# Patient Record
Sex: Female | Born: 1965
Health system: Southern US, Community
[De-identification: ages and names within clinical notes are randomized; demographics above are authoritative.]

## PROBLEM LIST (undated history)

## (undated) DIAGNOSIS — K449 Diaphragmatic hernia without obstruction or gangrene: Secondary | ICD-10-CM

## (undated) HISTORY — PX: VAGINAL PROLAPSE REPAIR: SHX830

## (undated) HISTORY — PX: TOTAL VAGINAL HYSTERECTOMY: SHX2548

## (undated) HISTORY — DX: Diaphragmatic hernia without obstruction or gangrene: K44.9

## (undated) HISTORY — PX: DILATION AND CURETTAGE OF UTERUS: SHX78

## (undated) HISTORY — PX: ABDOMINAL HYSTERECTOMY: SHX81

---

## 1997-12-27 ENCOUNTER — Other Ambulatory Visit: Admission: RE | Admit: 1997-12-27 | Discharge: 1997-12-27 | Payer: Self-pay | Admitting: Gynecology

## 1998-04-11 ENCOUNTER — Other Ambulatory Visit: Admission: RE | Admit: 1998-04-11 | Discharge: 1998-04-11 | Payer: Self-pay | Admitting: Gynecology

## 1998-06-10 ENCOUNTER — Ambulatory Visit (HOSPITAL_COMMUNITY): Admission: RE | Admit: 1998-06-10 | Discharge: 1998-06-10 | Payer: Self-pay | Admitting: Gynecology

## 1998-06-10 ENCOUNTER — Encounter: Payer: Self-pay | Admitting: Gynecology

## 1998-12-15 ENCOUNTER — Emergency Department (HOSPITAL_COMMUNITY): Admission: EM | Admit: 1998-12-15 | Discharge: 1998-12-15 | Payer: Self-pay | Admitting: Emergency Medicine

## 1999-01-03 ENCOUNTER — Other Ambulatory Visit: Admission: RE | Admit: 1999-01-03 | Discharge: 1999-01-03 | Payer: Self-pay | Admitting: Gynecology

## 1999-09-20 ENCOUNTER — Encounter: Admission: RE | Admit: 1999-09-20 | Discharge: 1999-12-19 | Payer: Self-pay | Admitting: Gynecology

## 1999-09-25 ENCOUNTER — Encounter: Payer: Self-pay | Admitting: Gynecology

## 1999-09-25 ENCOUNTER — Ambulatory Visit (HOSPITAL_COMMUNITY): Admission: RE | Admit: 1999-09-25 | Discharge: 1999-09-25 | Payer: Self-pay | Admitting: Gynecology

## 2000-01-11 ENCOUNTER — Other Ambulatory Visit: Admission: RE | Admit: 2000-01-11 | Discharge: 2000-01-11 | Payer: Self-pay | Admitting: Gynecology

## 2001-02-27 ENCOUNTER — Other Ambulatory Visit: Admission: RE | Admit: 2001-02-27 | Discharge: 2001-02-27 | Payer: Self-pay | Admitting: Gynecology

## 2001-06-25 ENCOUNTER — Encounter: Admission: RE | Admit: 2001-06-25 | Discharge: 2001-09-23 | Payer: Self-pay | Admitting: Gynecology

## 2001-12-21 ENCOUNTER — Inpatient Hospital Stay (HOSPITAL_COMMUNITY): Admission: AD | Admit: 2001-12-21 | Discharge: 2001-12-24 | Payer: Self-pay | Admitting: Gynecology

## 2002-01-28 ENCOUNTER — Other Ambulatory Visit: Admission: RE | Admit: 2002-01-28 | Discharge: 2002-01-28 | Payer: Self-pay | Admitting: Gynecology

## 2003-06-07 ENCOUNTER — Other Ambulatory Visit: Admission: RE | Admit: 2003-06-07 | Discharge: 2003-06-07 | Payer: Self-pay | Admitting: Gynecology

## 2004-06-12 ENCOUNTER — Other Ambulatory Visit: Admission: RE | Admit: 2004-06-12 | Discharge: 2004-06-12 | Payer: Self-pay | Admitting: Gynecology

## 2005-01-17 ENCOUNTER — Emergency Department (HOSPITAL_COMMUNITY): Admission: EM | Admit: 2005-01-17 | Discharge: 2005-01-17 | Payer: Self-pay | Admitting: Emergency Medicine

## 2005-03-05 ENCOUNTER — Inpatient Hospital Stay (HOSPITAL_COMMUNITY): Admission: RE | Admit: 2005-03-05 | Discharge: 2005-03-07 | Payer: Self-pay | Admitting: Gynecology

## 2005-03-05 ENCOUNTER — Encounter (INDEPENDENT_AMBULATORY_CARE_PROVIDER_SITE_OTHER): Payer: Self-pay | Admitting: *Deleted

## 2006-04-02 ENCOUNTER — Other Ambulatory Visit: Admission: RE | Admit: 2006-04-02 | Discharge: 2006-04-02 | Payer: Self-pay | Admitting: Gynecology

## 2006-05-30 ENCOUNTER — Encounter: Admission: RE | Admit: 2006-05-30 | Discharge: 2006-05-30 | Payer: Self-pay | Admitting: Gastroenterology

## 2007-04-07 ENCOUNTER — Other Ambulatory Visit: Admission: RE | Admit: 2007-04-07 | Discharge: 2007-04-07 | Payer: Self-pay | Admitting: Gynecology

## 2008-02-09 ENCOUNTER — Ambulatory Visit: Payer: Self-pay | Admitting: Gynecology

## 2008-04-19 ENCOUNTER — Other Ambulatory Visit: Admission: RE | Admit: 2008-04-19 | Discharge: 2008-04-19 | Payer: Self-pay | Admitting: Gynecology

## 2008-04-19 ENCOUNTER — Ambulatory Visit: Payer: Self-pay | Admitting: Gynecology

## 2008-04-19 ENCOUNTER — Encounter: Payer: Self-pay | Admitting: Gynecology

## 2009-03-15 ENCOUNTER — Ambulatory Visit: Payer: Self-pay | Admitting: Gynecology

## 2009-03-15 ENCOUNTER — Ambulatory Visit (HOSPITAL_COMMUNITY): Admission: RE | Admit: 2009-03-15 | Discharge: 2009-03-15 | Payer: Self-pay | Admitting: Gynecology

## 2009-04-20 ENCOUNTER — Ambulatory Visit: Payer: Self-pay | Admitting: Gynecology

## 2009-04-20 ENCOUNTER — Other Ambulatory Visit: Admission: RE | Admit: 2009-04-20 | Discharge: 2009-04-20 | Payer: Self-pay | Admitting: Gynecology

## 2009-11-08 ENCOUNTER — Ambulatory Visit: Payer: Self-pay | Admitting: Gynecology

## 2010-01-31 ENCOUNTER — Ambulatory Visit: Payer: Self-pay | Admitting: Gynecology

## 2010-02-14 ENCOUNTER — Encounter: Admission: RE | Admit: 2010-02-14 | Discharge: 2010-02-14 | Payer: Self-pay | Admitting: General Surgery

## 2010-04-21 ENCOUNTER — Ambulatory Visit: Payer: Self-pay | Admitting: Gynecology

## 2010-04-21 ENCOUNTER — Other Ambulatory Visit
Admission: RE | Admit: 2010-04-21 | Discharge: 2010-04-21 | Payer: Self-pay | Source: Home / Self Care | Admitting: Gynecology

## 2010-05-03 ENCOUNTER — Ambulatory Visit: Payer: Self-pay | Admitting: Gynecology

## 2010-05-12 ENCOUNTER — Ambulatory Visit: Admit: 2010-05-12 | Payer: Self-pay | Admitting: Gynecology

## 2010-09-29 NOTE — Discharge Summary (Signed)
   [name], Cheryl Booth                             ACCOUNT NO.:  [id number]   MEDICAL RECORD NO.:  [id number]                   PATIENT TYPE:   LOCATION:                                       FACILITY:   PHYSICIAN:  [id number]                                DATE OF BIRTH:   DATE OF ADMISSION:  12/20/2001  DATE OF DISCHARGE:  12/24/2001                                 DISCHARGE SUMMARY   DISCHARGE DIAGNOSES:  1. Intrauterine pregnancy 40+ weeks delivered.  2. Advanced maternal age.  3. Status post spontaneous vaginal delivery.   HISTORY:  A 45year-old white female, gravida 3, para 0 with an estimated  date of confinement of 12/13/01. Prenatal course complicated by history of  fibroid uterus and advanced maternal age and subsequently underwent  amniocentesis with revealed normal chromosome.   HOSPITAL COURSE:  On 12/22/01 the patient was admitted for induction at 40+  weeks with Pitocin.  On 12/22/01 at 1525 the patient had a spontaneous  vaginal delivery of a female, Agpars 9 and 9, weighing 9 pounds and 12 ounces.  There was a midline episiotomy with a third degree laceration which was  repaired without difficulty.  Postpartum the patient was afebrile, voided  and in stable condition.  She was discharged home on 12/24/01 in  satisfactory condition.   LABORATORY DATA:  The patient is 0 positive. Rubella immune.  On 12/23/01  patient's hemoglobin was 9.5 and hematocrit 27.7.   DISPOSITION:  The patient was discharged home to return in six weeks and  given a prescription for Motrin 600 mg q.h.s. p.r.n. pain.     Susa Loffler, P.A.                    [phone]    TSG/MEDQ  D:  01/23/2002  T:  01/23/2002  Job:  [id number]

## 2010-09-29 NOTE — Discharge Summary (Signed)
NAMEGERLINE, RATTO                 ACCOUNT NO.:  [id number]   MEDICAL RECORD NO.:  [id number]          PATIENT TYPE:  INP   LOCATION:  9309                          FACILITY:  [facility]   PHYSICIAN:  Juan H. Lily Peer, M.D.DATE OF BIRTH:  08/31/1962   DATE OF ADMISSION:  03/05/2005  DATE OF DISCHARGE:  03/07/2005                                 DISCHARGE SUMMARY   TOTAL DAYS HOSPITALIZED:  Two.   HISTORY:  The patient is a 45 year old gravida 3, para 1, AB 2, who was  admitted on the morning of October 23, where she underwent a transvaginal  hysterectomy with anterior and posterior colporrhaphy secondary to pelvic  relaxation.  No stress urinary incontinence and no enterocele.  The patient  intraoperatively did well.  She had a blood loss of 50 mL and received 2000  mL of lactated Ringer's.  Urine output had been 200 mL.  The patient  postoperative day #1 was doing well.  That evening she was complaining of  some discomfort in her chest.  She had a portable chest x-ray which was  normal.  Electrolytes were normal.  Cardiac enzymes were normal.  EKG with  normal sinus rhythm.  She started using incentive spirometry and her  symptoms improved.  Followed today before being discharged, on palpation it  appears that she has of a costochondritis because it was tender on the left  sternal border on palpation.  Cardiac auscultation was otherwise normal.  Her hemoglobin and hematocrit were 11.1 and 33.2, respectively, with a  platelet count of 289,000.  Her first postoperative day her diet was  advanced from clear liquid to a regular diet.  She was up and ambulating,  passing gas.  On the second postoperative day she showered.  There was no  vaginal bleeding.  Her abdomen had good bowel sounds.  The lungs were clear  to auscultation.  Heart regular rate and rhythm, no murmurs or gallops.  The  patient was afebrile and she was ready to be discharged home.   DIAGNOSES:  1.  Diagnosis pelvic  relaxation.  2.  Second-degree cystocele.  3.  Second-degree rectocele.   PROCEDURES PERFORMED:  1.  Transvaginal hysterectomy.  2.  Anterior and posterior colporrhaphy.   FINAL DISPOSITION AND FOLLOW-UP:  the patient was discharged home on HER  second postoperative day.  she was instructed to stay on stool softener, one  Colace before bedtime, and Metamucil 1 tablespoon with juice every morning,  to increase her fluid intake, and a high-fiber diet.  She was given a  prescription of Lortab 2.5/500 one p.o. q.4-6h. p.r.n. pain, and a  prescription for Reglan 30 mg to take one p.o. q.4-6h. p.r.n. nausea and/or  vomiting.  She was given a discharge instruction sheet, to avoid any lifting  or strenuous activity.  No driving for at least one week, and to follow up  in the office in two weeks for a postop visit.      Juan H. Lily Peer, M.D.  Electronically Signed    JHF/MEDQ  D:  03/07/2005  T:  03/07/2005  Job:  [id number]

## 2010-09-29 NOTE — H&P (Signed)
NAMETERISSA, Cheryl Booth                 ACCOUNT NO.:  [id number]   MEDICAL RECORD NO.:  [id number]          PATIENT TYPE:  INP   LOCATION:  NA                            [facility]  [facility]   PHYSICIAN:  Juan H. Lily Peer, M.D.DATE OF BIRTH:  08/31/1962   DATE OF ADMISSION:  03/05/2005  DATE OF DISCHARGE:                                HISTORY & PHYSICAL   CHIEF COMPLAINT:  1.  Symptomatic uterine prolapse.  2.  Second-degree cystocele, first-degree rectocele.   HISTORY OF PRESENT ILLNESS:  The patient is a 45 year old, gravida 3, para  1, AB2, with a several-year history of pelvic relaxation, cystocele and  rectocele.  The patient with no evidence of stress urinary incontinence.  She had postponed her surgery because of personal issues.  She has had a  pessary through all this time and now is prepared to undergo transvaginal  hysterectomy with ovarian conservation and anterior and posterior  colporrhaphy.  The patient on last annual gynecological examination with Pap  smear had been in January of this year, whereby her Pap smear was reported  to be normal.  Her last mammogram was in February of this year which was  normal as well.  The patient had an ultrasound done in the office in August  of this year.  She has a retroverted uterus measuring 7.3 x 5.4 x 5.1 cm  with an endometrial stripe of 8.1 mm and echogenic.  She has several small  intramural myomas less than 2 cm in size.  Right ovary was normal.  Left  ovary had a simple echo-free cyst, avascular, measuring 20 x 17 x 22 mm.  There was no fluid in the cul-de-sac.  The patient is now consented to  proceed with a transvaginal hysterectomy with anterior and posterior  colporrhaphy.   ALLERGIES:  ASPIRIN, NONSTEROIDAL ANTI-INFLAMMATORY DRUGS.   PAST MEDICAL HISTORY:  1.  She has had one normal spontaneous vagina delivery with macrosomia      infant.  2.  She wears a pessary for pelvic relaxation support.   CURRENT MEDICATIONS:  1.   Multivitamin.  2.  Calcium.  3.  Vitamin C.  4.  Folic acid.   PAST SURGICAL HISTORY:  Two D&Cs in the past.   PHYSICAL EXAMINATION:  VITAL SIGNS:  Weight 163 pounds, 5 feet 1/2 inch  tall.  Blood pressure 126/80.  HEENT:  Unremarkable.  NECK:  Supple.  Trachea midline.  No carotid bruits.  No thyromegaly.  LUNGS:  Clear to auscultation without rhonchi or wheezes.  HEART:  Regular rate and rhythm.  No murmurs or gallops.  ABDOMEN:  Soft, nontender, without rebound or guarding.  PELVIC:  Bartholin's, urethra, and Skene's glands were within normal limits.  Vagina and cervix:  The patient with a first to second degree rectocele,  first to second degree cystocele.  No enterocele.  Pelvic relaxation was  evident in the erect position.  RECTAL:  Exam was unremarkable.   ASSESSMENT:  A 45 year old, gravida 3, para 1, AB 2, with symptomatic pelvic  relaxation, second-degree cystocele, first to second degree rectocele  with  pelvic relaxation, no evidence of enterocele.  The patient interested in  proceeding with definitive surgery at this point but with ovarian  conservation, so she is planned for a transvaginal hysterectomy with  anterior and posterior colporrhaphy.  The risks, benefits, and pros and cons  of the operation were discussed with the patient to include infection (she  will receive prophylaxis antibiotics), the risks for deep venous thrombosis  (she will have pneumatics compression stockings to prevent DVT).  In the  event of uncontrollable hemorrhage, as a life-saving measure and if the  patient were to need blood or blood products, she is fully aware of the  potential risks for anaphylactic reaction, hepatitis, and AIDS.  In the  event of technical difficulty, the procedure would not be able to be  accomplished vaginally or in the event of any trauma, the patient is fully  aware that an abdominal approach may need to be undertaken at that point.  All of this information was  discussed with the patient in Spanish on several  occasions in the past as well as most recent preoperative consultation, but  she was also given literature information on the surgery as well.  She was  asked to start on Premarin vaginal cream to apply intravaginally daily for 2  weeks prior to her surgery to facilitate the dissection.  All questions were  answered and will follow accordingly.   PLAN:  The patient is scheduled for transvaginal hysterectomy with anterior  posterior colporrhaphy on Monday, October 23, at [facility].      [place] H. Lily Peer, M.D.  Electronically Signed     JHF/MEDQ  D:  02/28/2005  T:  02/28/2005  Job:  [id number]

## 2010-09-29 NOTE — Op Note (Signed)
NAMEMATTIE, Cheryl Booth                 ACCOUNT NO.:  [id number]   MEDICAL RECORD NO.:  [id number]          PATIENT TYPE:  INP   LOCATION:  9309                          FACILITY:  [facility]   PHYSICIAN:  Juan H. Lily Peer, M.D.DATE OF BIRTH:  08/31/1962   DATE OF PROCEDURE:  03/05/2005  DATE OF DISCHARGE:                                 OPERATIVE REPORT   SURGEON:  Juan H. Lily Peer, M.D.   ASSISTANTMarcial Pacas P. Fontaine, M.D.   INDICATIONS FOR OPERATION:  45 year old with symptomatic uterine prolapse  with second-degree cystocele and second-degree rectocele.   PREOPERATIVE DIAGNOSIS:  1.  Pelvic relaxation.  2.  Second-degree cystocele.  3.  Second-degree rectocele.   ANESTHESIA:  Was general endotracheal anesthesia.   PROCEDURE PERFORMED:  Transvaginal hysterectomy with anterior and posterior  colporrhaphy.   FINDINGS:  The patient with pelvic relaxation. No evidence of enterocele.  Second-degree cystocele and second-degree rectocele. Normal-appearing  ovaries.   DESCRIPTION OF OPERATION:  After the patient was adequately counseled, she  was taken to the operating room where she underwent general endotracheal  anesthesia. She had PSA stockings for DVT prophylaxis and had received 2  grams of cefotetan for prophylaxis as well. Her legs were placed and she was  placed in the high lithotomy position and the vagina and perineum were  prepped and draped in usual sterile fashion. The short weighted speculum was  placed in posterior vaginal vault. A Foley catheter was inserted in effort  to monitor urinary output. Two Lahey thyroid clamps, one was placed in the  anterior cervical lip. The second on the posterior cervical lip. 2%  Xylocaine with 1:100,000 epinephrine was infiltrated into the cervical  vaginal fold in a circumferential fashion for a total of 10 mL. With the use  of a scalpel the cervical vaginal fold was identified and incision was made  in the circumferential fashion.  Division of the supravaginal septum with a  scalpel was accomplished and the supravaginal septum was incised with a  scissors and cranial displacement of bladder with retractors placed between  the vesicouterine ligaments was accomplished. The peritoneal reflection was  identified. Digital displacement of the vesicouterine ligaments laterally  and the bladder and ureters cranially. The ureters were then palpated  against the lateral speculum. The anterior cul-de-sac was opened and the  retractor was placed in the anterior cul-de-sac. The posterior incision was  accomplished with the Mayo scissors and posterior colpotomy was  accomplished. A short weighted speculum was changed to a long weighted  speculum and both uterosacral ligaments were clamped, cut, suture ligated  with 0 Vicryl suture. The remaining cardinal and broad ligaments were  clamped, cut and suture ligated with 0 Vicryl suture. The uterine vessels  were clamped, cut and suture ligated with 0 Vicryl suture. The left adnexal  pedicle and round ligament was clamped and cut. Similar procedure was  carried out on the contralateral side and the uterus and cervix was passed  off the operative field. The pedicles were free tied with 0 Vicryl suture  followed by transfixation stitch of 0 Vicryl suture.  A tagged moist lap was  placed into the vagina and the anterior colpotomy was started. The vaginal  mucosa was undermined in the vesicle vaginal space separating the connective  tissue from the vaginal wall and the vesicovaginal space was accomplished  with the Metzenbaum scissors. The insertion of the urogenital diaphragm at  the vaginal wall was divided sharply. Wide lateral exposure of the bladder  and the vesicovaginal space with the Metzenbaum scissors was accomplished  for dissection. Further exposure of the bladder laterally of the  vesicovaginal space with digital dissection. The anterior repair was started  by approximating the  vesicouterine ligament with 2-0 Vicryl suture in  interrupted fashion along with plication of the bladder fascia with  interrupted sutures of 2-0 Vicryl suture to include and approximate the  retracted structures of the urogenital diaphragm. The anterior colporrhaphy  was completed and the excess vaginal mucosa was excised and the vaginal  mucosa was then closed with the running locking stitch of 0 Vicryl suture.  The vaginal cuff was then closed with a running interrupted sutures of 0  Vicryl suture and the posterior colpotomy was accomplished by undermining  the posterior vaginal wall and completed posterior colpotomy with sharp  dissection with the Metzenbaum scissors was accomplished and blunt digital  dissection developing of the rectovaginal space and isolated connective  tissue fibers were divided with a Metzenbaum scissors. Plication of the  rectal pelvis was accomplished with a interrupted sutures of 2-0 Vicryl  suture and connective tissue plate consisting of the rectal pillar the  rectovaginal connective tissue over the rectum was identified. The tissue  plate extended to the upper end of the vagina. Further dissection of the  vagina laterally to expose the levators. The upper portion of the incision  had been closed with interrupted sutures of 2-0 Vicryl suture and excess  mucosa was resected. The remaining vaginal mucosa was closed with running  locking stitch of 0 Vicryl suture. The vagina was irrigated with normal  saline solution. The vagina was packed with Kerlix impregnated with Estrace  cream. The patient tolerated procedure well, was extubated, transferred to  recovery room with stable vital signs. Blood loss 50 mL. Urine output 210 mL  and IV fluids consisted 2100 mL of lactated Ringer's.   INDICATIONS:      Juan H. Lily Peer, M.D.  Electronically Signed     JHF/MEDQ  D:  03/05/2005  T:  03/05/2005  Job:  [id number]

## 2011-04-19 ENCOUNTER — Encounter: Payer: Self-pay | Admitting: Anesthesiology

## 2011-04-23 ENCOUNTER — Other Ambulatory Visit (HOSPITAL_COMMUNITY)
Admission: RE | Admit: 2011-04-23 | Discharge: 2011-04-23 | Disposition: A | Discharge status: Home / Self Care | Payer: No Typology Code available for payment source | Source: Ambulatory Visit | Attending: Gynecology | Admitting: Gynecology

## 2011-04-23 ENCOUNTER — Ambulatory Visit (INDEPENDENT_AMBULATORY_CARE_PROVIDER_SITE_OTHER): Payer: No Typology Code available for payment source | Admitting: Gynecology

## 2011-04-23 ENCOUNTER — Encounter: Payer: Self-pay | Admitting: Gynecology

## 2011-04-23 VITALS — BP 126/80 | Ht 59.75 in | Wt 155.0 lb

## 2011-04-23 DIAGNOSIS — Z01419 Encounter for gynecological examination (general) (routine) without abnormal findings: Secondary | ICD-10-CM | POA: Insufficient documentation

## 2011-04-23 DIAGNOSIS — Z13220 Encounter for screening for lipoid disorders: Secondary | ICD-10-CM

## 2011-04-23 DIAGNOSIS — R635 Abnormal weight gain: Secondary | ICD-10-CM

## 2011-04-23 DIAGNOSIS — R823 Hemoglobinuria: Secondary | ICD-10-CM

## 2011-04-23 DIAGNOSIS — Z131 Encounter for screening for diabetes mellitus: Secondary | ICD-10-CM

## 2011-04-23 LAB — GLUCOSE, RANDOM: Glucose, Bld: 94 mg/dL (ref 70–99)

## 2011-04-23 NOTE — Progress Notes (Signed)
Cheryl Booth [birth year] [id number]   History:    45 y.o.  for annual exam with no complaints today. Review of her record indicated that her last mammogram was December of this year which was normal. She does her monthly self breast examination. Patient with prior history transvaginal hysterectomy along with an A&P repair. She had a colonoscopy in 2011 were by benign polyps were removed. Review of her record indicated she was weighing 163 is down to 155. She is taking her calcium and vitamin D for osteoporosis prevention.  Past medical history,surgical history, family history and social history were all reviewed and documented in the EPIC chart.  Gynecologic History Patient's last menstrual period was 04/22/2005. Contraception: none Last Pap: 2011. Results were: normal Last mammogram: 2012. Results were:normal}  Obstetric History OB History    Grav Para Term Preterm Abortions TAB SAB Ect Mult Living   2 1 1  1  1   1      # Outc Date GA Lbr Len/2nd Wgt Sex Del Anes PTL Lv   1 TRM     M SVD  No Yes   2 SAB                ROS:  Was performed and pertinent positives and negatives are included in the history.  Exam: chaperone present  BP 126/80  Ht 4' 11.75" (1.518 m)  Wt 155 lb (70.308 kg)  BMI 30.53 kg/m2  LMP 04/22/2005  Body mass index is 30.53 kg/(m^2).  General appearance : Well developed well nourished female. No acute distress HEENT: Neck supple, trachea midline, no carotid bruits, no thyroidmegaly Lungs: Clear to auscultation, no rhonchi or wheezes, or rib retractions  Heart: Regular rate and rhythm, no murmurs or gallops Breast:Examined in sitting and supine position were symmetrical in appearance, no palpable masses or tenderness,  no skin retraction, no nipple inversion, no nipple discharge, no skin discoloration, no axillary or supraclavicular lymphadenopathy Abdomen: no palpable masses or tenderness, no rebound or guarding Extremities: no edema or skin discoloration  or tenderness  Pelvic:  Bartholin, Urethra, Skene Glands: Within normal limits             Vagina: No gross lesions or discharge  Cervix: Absent  Uterus absent Adnexa  Without masses or tenderness  Anus and perineum  normal   Rectovaginal  normal sphincter tone without palpated masses or tenderness             Hemoccult not done recent colonoscopy     Assessment/Plan:  45 y.o. female for annual exam unremarkable. She is instructed to engage in weightbearing exercises 45 minutes 3 or 4 times a week. She's to continue her calcium and vitamin D twice a day as previously recommended. We'll have her stop by the lab to check the following labs: CBC, cholesterol, random blood sugar, urinalysis, TSH along with her Pap smear. Will notify does any abnormality of any the above mentioned tests otherwise we'll see her back in one year or when necessary.    Ok Edwards MD, 12:06 PM 04/23/2011

## 2011-04-23 NOTE — Patient Instructions (Signed)
Will call you if there are any abnormalities in your labs. Have a great Christmas!

## 2011-04-24 ENCOUNTER — Encounter: Payer: Self-pay | Admitting: Gynecology

## 2011-07-30 ENCOUNTER — Ambulatory Visit (INDEPENDENT_AMBULATORY_CARE_PROVIDER_SITE_OTHER): Payer: No Typology Code available for payment source | Admitting: Family Medicine

## 2011-07-30 ENCOUNTER — Ambulatory Visit: Payer: No Typology Code available for payment source

## 2011-07-30 VITALS — BP 129/86 | HR 76 | Temp 98.6°F | Resp 20 | Ht 62.0 in | Wt 156.0 lb

## 2011-07-30 DIAGNOSIS — J329 Chronic sinusitis, unspecified: Secondary | ICD-10-CM

## 2011-07-30 DIAGNOSIS — R059 Cough, unspecified: Secondary | ICD-10-CM

## 2011-07-30 DIAGNOSIS — J310 Chronic rhinitis: Secondary | ICD-10-CM

## 2011-07-30 DIAGNOSIS — R05 Cough: Secondary | ICD-10-CM

## 2011-07-30 MED ORDER — FLUTICASONE PROPIONATE 50 MCG/ACT NA SUSP: 2.0000 | Freq: Every day | NASAL | Status: AC

## 2011-07-30 MED ORDER — BENZONATATE 100 MG PO CAPS: 100.0000 mg | ORAL_CAPSULE | Freq: Two times a day (BID) | ORAL | Status: AC | PRN

## 2011-07-30 MED ORDER — AZITHROMYCIN 250 MG PO TABS: ORAL_TABLET | ORAL | Status: AC

## 2011-07-30 MED ORDER — HYDROCODONE-HOMATROPINE 5-1.5 MG/5ML PO SYRP: 5.0000 mL | ORAL_SOLUTION | Freq: Three times a day (TID) | ORAL | Status: AC | PRN

## 2011-07-30 MED ORDER — AZITHROMYCIN 250 MG PO TABS: ORAL_TABLET | ORAL | Status: DC

## 2011-07-30 NOTE — Progress Notes (Signed)
[facility]:  Office Visit  Chief Complaint:  Chief Complaint  Patient presents with  . Hoarse    headaches also  . Cough    x 2 weeks  . Nasal Congestion    mainly chest congestion    HPI: Cheryl Booth is a 46 y.o. female who complains of  2 week h/o dry cough, sore throat. Tried muconex without releif. HOney and tea without relief   Past Medical History  Diagnosis Date  . NSVD (normal spontaneous vaginal delivery)   . Hiatal hernia    Past Surgical History  Procedure Date  . Dilation and curettage of uterus     x2  . Abdominal hysterectomy     TVH/ A&P REPAIR   History   Social History  . Marital Status: Married    Spouse Name: N/A    Number of Children: N/A  . Years of Education: N/A   Social History Main Topics  . Smoking status: Never Smoker   . Smokeless tobacco: Never Used  . Alcohol Use: No  . Drug Use: No  . Sexually Active: Yes    Birth Control/ Protection: Surgical     HYSTERECTOMY   Other Topics Concern  . None   Social History Narrative  . None   Family History  Problem Relation Age of Onset  . Cancer Maternal Aunt 60    OVARIAN ( DESEASED)  . Cancer Maternal Aunt 60    STOMACH (DESEASED)  . Cancer Maternal Aunt     THYROID (DESEASED)   Allergies  Allergen Reactions  . Aspirin   . Nsaids    Prior to Admission medications   Medication Sig Start Date End Date Taking? Authorizing Provider  calcium carbonate (OS-CAL) 600 MG TABS Take 600 mg by mouth 2 (two) times daily with a meal.      Historical Provider, MD  Multiple Vitamin (MULTIVITAMIN) tablet Take 1 tablet by mouth daily.      Historical Provider, MD  Testosterone Propionate 2 % CREA Place onto the skin.      Historical Provider, MD     ROS: The patient denies fevers, chills, night sweats, unintentional weight loss, chest pain, palpitations, wheezing, dyspnea on exertion, nausea, vomiting, abdominal pain, dysuria, hematuria, melena, numbness, weakness, or  tingling. +cough  All other systems have been reviewed and were otherwise negative with the exception of those mentioned in the HPI and as above.    PHYSICAL EXAM: Filed Vitals:   07/30/11 1806  BP: 129/86  Pulse: 76  Temp: 98.6 F (37 C)  Resp: 20   Filed Vitals:   07/30/11 1806  Height: 5\' 2"  (1.575 m)  Weight: 156 lb (70.761 kg)   Body mass index is 28.53 kg/(m^2).  General: Alert, no acute distress HEENT:  Normocephalic, atraumatic, oropharynx patent. Tm nl. Erythematous tonsil, normal. + max sinus tenderness Cardiovascular:  Regular rate and rhythm, no rubs murmurs or gallops.  No Carotid bruits, radial pulse intact. No pedal edema.  Respiratory: Clear to auscultation bilaterally.  No wheezes, rales, or rhonchi.  No cyanosis, no use of accessory musculature GI: No organomegaly, abdomen is soft and non-tender, positive bowel sounds.  No masses. Skin: No rashes. Neurologic: Facial musculature symmetric. Psychiatric: Patient is appropriate throughout our interaction. Lymphatic: No cervical lymphadenopathy Musculoskeletal: Gait intact.   LABS: Results for orders placed in visit on 04/23/11  GLUCOSE, RANDOM      Component Value Range   Glucose, Bld 94  70 -  99 (mg/dL)     EKG/XRAY:   Primary read interpreted by Dr. Conley Rolls at [facility]. NO active cardiopulmonary disease.   ASSESSMENT/PLAN: Encounter Diagnoses  Name Primary?  . Cough Yes  . Sinusitis   . Rhinitis    Flonase Z-pack Hydromet syrup    Burma Ketcher PHUONG, DO 07/30/2011 6:57 PM

## 2011-07-31 ENCOUNTER — Telehealth: Payer: Self-pay

## 2011-07-31 NOTE — Telephone Encounter (Signed)
LM regarding how she is feeling. Possibly secondary to hydromet syrup which has codeine derivative in it. IF she is feeling worse she can return to office otherwise try stopping the Hydromet.

## 2011-07-31 NOTE — Telephone Encounter (Signed)
Pt is vomiting and very sick wants to know what to do.  Saw dr. Conley Rolls yesterday  Please call [phone]

## 2011-08-01 ENCOUNTER — Telehealth: Payer: Self-pay

## 2011-08-01 NOTE — Telephone Encounter (Signed)
Pt had reaction to hyrdrocodone (nausea/vomitting/dizziness). She states she cannot take this anymore. Can we prescribe something else? She wants something non-narcotic.   [facility]--[phone], [address]

## 2011-08-01 NOTE — Telephone Encounter (Signed)
She has tessalon perles and that should help with her cough, it is not a narcotic.

## 2011-08-01 NOTE — Telephone Encounter (Signed)
Spoke with pt and D/W her N/V might be from hycodan. Pt agreed and said she stopped taking the hycodan and she is no longer having N/V. Instr'd pt to take Mucinex and drink plenty of water for her mucus she is having. Pt stated she has Mucinex-DM and advised her she can take both that and Tessalon Perles per Chelle. Pt agreed and will RTC if doesn't improve.

## 2011-08-01 NOTE — Telephone Encounter (Signed)
Pt states she nearly died last night, the medication tried to kill her.   She is allergic to aspirins and now codine Please call [phone]

## 2011-08-02 NOTE — Telephone Encounter (Signed)
Tried to call pt twice and both times it sounded like she answered but nothing happened

## 2011-08-03 NOTE — Telephone Encounter (Signed)
Gave pt information about tessalon perles. She stated that the cough is not bothering her as much as choking on mucus. Pt stated she took a decongestant and it is helping some. D/W pt continued use of Mucinex as well. Pt agreed.

## 2012-04-24 ENCOUNTER — Ambulatory Visit (INDEPENDENT_AMBULATORY_CARE_PROVIDER_SITE_OTHER): Payer: No Typology Code available for payment source | Admitting: Gynecology

## 2012-04-24 ENCOUNTER — Encounter: Payer: Self-pay | Admitting: Gynecology

## 2012-04-24 VITALS — BP 118/80 | Ht 59.75 in | Wt 158.0 lb

## 2012-04-24 DIAGNOSIS — Z23 Encounter for immunization: Secondary | ICD-10-CM

## 2012-04-24 DIAGNOSIS — R635 Abnormal weight gain: Secondary | ICD-10-CM

## 2012-04-24 DIAGNOSIS — Z86010 Personal history of colonic polyps: Secondary | ICD-10-CM | POA: Insufficient documentation

## 2012-04-24 DIAGNOSIS — K449 Diaphragmatic hernia without obstruction or gangrene: Secondary | ICD-10-CM | POA: Insufficient documentation

## 2012-04-24 DIAGNOSIS — Z01419 Encounter for gynecological examination (general) (routine) without abnormal findings: Secondary | ICD-10-CM

## 2012-04-24 LAB — COMPREHENSIVE METABOLIC PANEL
ALT: 23 U/L (ref 0–35)
AST: 21 U/L (ref 0–37)
Albumin: 4.5 g/dL (ref 3.5–5.2)
Alkaline Phosphatase: 69 U/L (ref 39–117)
BUN: 18 mg/dL (ref 6–23)
Creat: 0.7 mg/dL (ref 0.50–1.10)
Potassium: 4.7 mEq/L (ref 3.5–5.3)

## 2012-04-24 LAB — CBC WITH DIFFERENTIAL/PLATELET
Eosinophils Absolute: 0.2 10*3/uL (ref 0.0–0.7)
Hemoglobin: 13.1 g/dL (ref 12.0–15.0)
Lymphs Abs: 2.9 10*3/uL (ref 0.7–4.0)
MCH: 29.5 pg (ref 26.0–34.0)
Monocytes Relative: 5 % (ref 3–12)
Neutro Abs: 3.5 10*3/uL (ref 1.7–7.7)
Neutrophils Relative %: 50 % (ref 43–77)
Platelets: 280 10*3/uL (ref 150–400)
RBC: 4.44 MIL/uL (ref 3.87–5.11)
WBC: 7.1 10*3/uL (ref 4.0–10.5)

## 2012-04-24 LAB — HEMOGLOBIN A1C
Hgb A1c MFr Bld: 5.8 % — ABNORMAL HIGH (ref <5.7)
Mean Plasma Glucose: 120 mg/dL — ABNORMAL HIGH (ref <117)

## 2012-04-24 LAB — TSH: TSH: 1.31 u[IU]/mL (ref 0.350–4.500)

## 2012-04-24 LAB — CHOLESTEROL, TOTAL: Cholesterol: 203 mg/dL — ABNORMAL HIGH (ref 0–200)

## 2012-04-24 NOTE — Addendum Note (Signed)
Addended by: Bertram Savin A on: 04/24/2012 10:44 AM   Modules accepted: Orders

## 2012-04-24 NOTE — [place] Notes (Signed)
Cheryl Booth 01-22-1966 [id number]   History:    46 y.o.  for annual gyn exam with no complaints today. Patient with prior history transvaginal hysterectomy with anterior and posterior colporrhaphy. Review of patient's records indicated 2011 she had resection of colon polyp which was benign. Patient would know prior history of abnormal Pap smear. Mammogram was done this week result pending at time of this dictation. Patient in frequently does her self breast examination. Patient interested in flu vaccine today.  Past medical history,surgical history, family history and social history were all reviewed and documented in the EPIC chart.  Gynecologic History Patient's last menstrual period was 04/22/2005. Contraception: status post hysterectomy Last Pap: 2012. Results were: normal Last mammogram: 2013. Results were: Done this week result pending  Obstetric History OB History    Grav Para Term Preterm Abortions TAB SAB Ect Mult Living   2 1 1  1  1   1      # Outc Date GA Lbr Len/2nd Wgt Sex Del Anes PTL Lv   1 TRM     M SVD  No Yes   2 SAB                ROS: A ROS was performed and pertinent positives and negatives are included in the history.  GENERAL: No fevers or chills. HEENT: No change in vision, no earache, sore throat or sinus congestion. NECK: No pain or stiffness. CARDIOVASCULAR: No chest pain or pressure. No palpitations. PULMONARY: No shortness of breath, cough or wheeze. GASTROINTESTINAL: No abdominal pain, nausea, vomiting or diarrhea, melena or bright red blood per rectum. GENITOURINARY: No urinary frequency, urgency, hesitancy or dysuria. MUSCULOSKELETAL: No joint or muscle pain, no back pain, no recent trauma. DERMATOLOGIC: No rash, no itching, no lesions. ENDOCRINE: No polyuria, polydipsia, no heat or cold intolerance. No recent change in weight. HEMATOLOGICAL: No anemia or easy bruising or bleeding. NEUROLOGIC: No headache, seizures, numbness, tingling or weakness.  PSYCHIATRIC: No depression, no loss of interest in normal activity or change in sleep pattern.     Exam: chaperone present  BP 118/80  Ht 4' 11.75" (1.518 m)  Wt 158 lb (71.668 kg)  BMI 31.12 kg/m2  LMP 04/22/2005  Body mass index is 31.12 kg/(m^2).  General appearance : Well developed well nourished female. No acute distress HEENT: Neck supple, trachea midline, no carotid bruits, no thyroidmegaly Lungs: Clear to auscultation, no rhonchi or wheezes, or rib retractions  Heart: Regular rate and rhythm, no murmurs or gallops Breast:Examined in sitting and supine position were symmetrical in appearance, no palpable masses or tenderness,  no skin retraction, no nipple inversion, no nipple discharge, no skin discoloration, no axillary or supraclavicular lymphadenopathy Abdomen: no palpable masses or tenderness, no rebound or guarding Extremities: no edema or skin discoloration or tenderness  Pelvic:  Bartholin, Urethra, Skene Glands: Within normal limits             Vagina: No gross lesions or discharge  Cervix: Absent  Uterus absent Adnexa  Without masses or tenderness  Anus and perineum  normal   Rectovaginal  normal sphincter tone without palpated masses or tenderness             Hemoccult not done     Assessment/Plan:  46 y.o. female for annual exam who was reminded to schedule her colonoscopy in the next couple months because a past history of colon polyps. Patient to receive the flu vaccine today. The following labs were ordered today: CBC, TSH,  hemoglobin A1c, total cholesterol, urinalysis and comprehensive metabolic panel. No Pap smear done today Was discussed. We discussed importance of monthly self breast examination as well as importance of calcium and vitamin D with regular exercise for osteoporosis prevention.    Ok Edwards MD, 10:34 AM 04/24/2012

## 2012-04-24 NOTE — Patient Instructions (Signed)
Vacuna desactivada contra la influenza, Lo que usted necesita saber (Inactivated Influenza Vaccine, What You Need to Know) POR QU VACUNARSE?  La influenza (conocida como gripe o "flu") es una enfermedad contagiosa.  Es causada por el virus de la influenza, que se puede transmitir al toser, estornudar o mediante las secreciones nasales.  A cualquiera le puede dar influenza, pero los ndices de infeccin son mayores entre los nios. La mayora de las personas solo experimentan sntomas por unos pocos das e incluyen:  Fiebre o escalofros.  Dolor de garganta.  Dolores musculares.  Cansancio.  Tos.  Dolor de cabeza.  Nariz moquienta o congestionada. Otras enfermedades pueden tener los mismos sntomas y a menudo se confunden con la influenza. Los nios pequeos, las personas mayores de 65 aos de edad, las mujeres embarazadas y las personas con ciertas condiciones de salud, como enfermedades del corazn, pulmn o rin o un sistema inmunolgico debilitado, se pueden enfermar mucho ms. La influenza puede causar fiebre alta y neumona y puede empeorar condiciones de salud preexistentes. Puede causar diarrea y convulsiones en los nios. Miles de personas mueren cada ao por la influenza y muchas ms requieren hospitalizacin. Si se vacuna, puede protegerse usted mismo y evitar contagiar a otros. VACUNA DESACTIVADA CONTRA LA INFLUENZA  Hay dos tipos de vacuna contra la influenza:  La vacuna inactivada (el virus est inactivo), de la "vacuna contra la gripe" se aplica con una aguja.  La vacuna viva atenuada (debilitado), que see aplica como roco en las fosas nasales. Esta vacuna se describe en una Hoja de Informacin sobre las Vacunas, por separado. Hay una "dosis ms alta" de vacuna desactivada disponible para personas mayores de 65 aos. Para ms informacin, consulte a su doctor.  Cada ao los cientficos tratan de que los virus de la vacuna coincidan con los que tienen ms  probabilidades de causar la influenza ese ao. La vacuna contra la influenza no prevendr otras enfermedades causadas por otros virus, incluyendo los virus de influenza que no estn incluidos en la vacuna. Despus de la vacunacin, toma hasta 2 semanas para desarrollar proteccin. La proteccin dura hasta un ao. Algunas vacunas desactivadas contra la influenza contienen un conservante llamado timerosal. La vacuna libre de timerosal tambin est disponible. Consulte a su doctor para ms informacin. QUINES DEBEN RECIBIR LA VACUNA DESACTIVADA CONTRA LA INFLUENZA Y CUNDO? QUINES  Todas las personas mayores de 6 meses de edad deben recibir la vacuna contra la influenza.  La vacunacin es especialmente importante para las personas con mayor riesgo de experimentar un caso grave de influenza y las que estn en contacto directo con ellas, incluyendo al personal mdico, y las personas en contacto cercano con bebs menores de 6 meses de edad. CUNDO Reciba la vacuna tan pronto como est disponible. Esto le dar la proteccin necesaria en caso de que la temporada de influenza llegue temprano. Puede vacunarse durante todo el tiempo en el que la enfermedad siga ocurriendo en su comunidad. La influenza puede ocurrir a cualquier momento, pero la mayora de influenza ocurre desde octubre hasta mayo. En las ltimas temporadas, la mayora de las infecciones han ocurrido en enero y febrero. Vacunndose en diciembre, o an despus, ser beneficioso en casi todos los aos. Los adultos y los nios mayores requieren una dosis de la vacuna contra la influenza cada ao. Sin embargo, algunos nios menores de 9 aos de edad necesitan dos dosis para estar protegidos. Consulte a su doctor. Se puede dar la vacuna contra la influenza a la misma   vez que otras vacunas, incluyendo la vacuna antineumoccica. ALGUNAS PERSONAS NO DEBEN RECIBIR LA VACUNA DESACTIVADA CONTRA LA INFLUENZA O DEBEN ESPERAR  Diga a su doctor si tiene  cualquier alergia grave (que amenaza la vida), incluyendo alergia grave a los huevos. Una grave alergia a cualquier componente de la vacuna puede ser razn para no vacunarse. Las reacciones alrgicas a la vacuna contra la influenza son poco comunes.  Diga a su doctor si alguna vez ha tenido una reaccin grave despus de haber recibido una dosis de la vacuna contra la influenza.  Diga a su doctor si alguna vez ha tenido el sndrome de Guillain-Barr (una enfermedad paraltica grave, tambin conocida como GBS). Su doctor le puede ayudar a decidir si es recomendable vacunarse.  Las personas moderadamente o muy enfermas por lo general deben esperar hasta recuperarse antes de vacunarse contra la influenza. Si est enfermo, hable con su doctor sobre si debe cambiar la cita para vacunarse. Las personas con una enfermedad leve por lo general se pueden vacunar. CULES SON LOS RIESGOS DE LA VACUNA DESACTIVADA CONTRA LA INFLUENZA? Los vacunas, como cualquier medicamento, pueden causar problemas serios, como reacciones alrgicas graves. El riesgo de que la vacuna cause un dao serio, o la muerte, es sumamente pequeo. Problemas serios de la vacuna desactivada contra la influenza ocurren muy rara vez. Los virus en la vacuna desactivada estn muertos o sea que no se puede enfermar de influenza mediante la vacuna. Problemas leves:  Molestia, enrojecimiento o hinchazn en el lugar donde lo vacunaron.  Ronquera; dolor, enrojecimiento y picazn en los ojos; tos.  Fiebre.  Dolores.  Dolor de cabeza.  Picazn.  Cansancio. Si estos problemas ocurren, en general comienzan poco tiempo despus de vacunarse y duran 1  2 das. Problemas moderados: Los nios pequeos que reciben la vacuna contra la influenza desactivada y la vacuna antineumoccica (PCV13) durante la misma cita parecen correr mayor riego de tener convulsiones por causa de fiebre. Consulte a su doctor para ms informacin. Diga a su doctor si el  nio que est recibiendo la vacuna contra la influenza ha tenido una convulsin. Problemas graves:  Las reacciones alrgicas que amenazan la vida ocurren muy rara vez despus de la vacunacin. Si ocurren, por lo general es a los pocos minutos o a las pocas horas de haberse vacunado.  En 1976, un tipo de vacuna contra la influenza (gripe porcina) estuvo asociado al sndrome de Guillain-Barr (GBS). Desde entonces, las vacunas contra la influenza no se han asociado claramente al GBS. Sin embargo, si hay un riesgo de GBS por las vacunas contra la influenza que se usan actualmente, no debe ser ms de 1  2 casos por milln de personas vacunadas. Eso es mucho menor que el riesgo de tener una influenza fuerte, que se puede prevenir con vacunacin. Siempre se seguir prestando atencin a la seguridad de las vacunas. Para ms informacin visite:  www.cdc.gov/vaccinesafety/Vaccine_Monitoring/Index.html y  www.cdc.gov/vaccinesafety/Activities/Activities_Index.html Una marca de la vacuna desactivada contra la influenza, llamada Afluria, no se debe dar a nios menores de 8 aos de edad, con la excepcin de circunstancias especiales. En [place] una vacuna relacionada estuvo asociada a fiebre y convulsiones febriles en nios pequeos. Su doctor le puede proporcionar ms informacin. QU PASA SI HAY UNA REACCIN GRAVE? A qu debo prestar atencin? Cualquier estado poco habitual, como fiebre alta o cambios en el comportamiento. Los signos de una reaccin alrgica grave pueden incluir dificultad para respirar, ronquera o sibilancias, ronchas, palidez, debilidad, latidos cardacos acelerados, o mareos. Qu debo   hacer?  Llame a un doctor o lleve a la persona inmediatamente a un doctor.  Dga a su doctor lo que ocurri, la fecha y hora en que ocurri, y cuando recibi la vacuna.  Pida a su mdico, enfermero o al departamento de salud, que informe sobre la reaccin llenando un formulario del Sistema de  Informacin de Reacciones Adversos a las Vacunas (VAERS, por sus siglas en ingls). O, puede presentar este informe mediante el sitio Web de VAERS, en:www.vaers.hhs.gov o llamando al: 1-800-822-7967. VAERS no proporciona consejos mdicos. PROGRAMA NACIONAL DE COMPENSACIN POR LESIONES CAUSADAS POR VACUNAS El Programa Nacional de Compensacin por Lesiones Causadas por las Vacunas (VICP) fue creado en 1986.  Las personas que piensan haber sido lesionadas por alguna vacuna pueden aprender acerca del programa y cmo presentar una reclamacin llamando al: 1-800-338-2382 o visitando el sitio Web de VICP enwww.hrsa.gov/vaccinecompensation CMO PUEDO OBTENER MS INFORMACIN?  Consulte a su doctor. Le pueden dar el folleto de informacin que viene con la vacuna o sugerirle otras fuentes de informacin.  Llame al departamento de salud local o estatal.  Comunquese con los Centros para el Control y la Prevencin de Enfermedades (CDC):  Llame al 1-800-232-4636 (1-800-CDC-INFO) o  Visite el sitio Web de los CDC en www.cdc.gov/flu CDC Inactivated Influenza Vaccine-Spanish VIS (11/13/10) Document Released: 07/27/2008 Document Revised: 07/23/2011 ExitCare Patient Information 2013 [employer].  

## 2012-04-25 ENCOUNTER — Other Ambulatory Visit: Payer: Self-pay | Admitting: Gynecology

## 2012-04-25 DIAGNOSIS — E7800 Pure hypercholesterolemia, unspecified: Secondary | ICD-10-CM

## 2012-04-25 DIAGNOSIS — R7309 Other abnormal glucose: Secondary | ICD-10-CM

## 2012-04-25 LAB — URINALYSIS W MICROSCOPIC + REFLEX CULTURE
Bacteria, UA: NONE SEEN
Bilirubin Urine: NEGATIVE
Casts: NONE SEEN
Crystals: NONE SEEN
Hgb urine dipstick: NEGATIVE
Ketones, ur: NEGATIVE mg/dL
Specific Gravity, Urine: 1.017 (ref 1.005–1.030)
pH: 7.5 (ref 5.0–8.0)

## 2012-04-28 ENCOUNTER — Other Ambulatory Visit: Payer: No Typology Code available for payment source

## 2012-04-28 DIAGNOSIS — E7800 Pure hypercholesterolemia, unspecified: Secondary | ICD-10-CM

## 2012-04-28 DIAGNOSIS — R7309 Other abnormal glucose: Secondary | ICD-10-CM

## 2012-04-29 LAB — GLUCOSE, FASTING: Glucose, Fasting: 88 mg/dL (ref 70–99)

## 2012-04-30 ENCOUNTER — Other Ambulatory Visit: Payer: Self-pay | Admitting: Gynecology

## 2012-04-30 DIAGNOSIS — E7800 Pure hypercholesterolemia, unspecified: Secondary | ICD-10-CM

## 2013-03-09 ENCOUNTER — Ambulatory Visit (INDEPENDENT_AMBULATORY_CARE_PROVIDER_SITE_OTHER): Payer: No Typology Code available for payment source | Admitting: Internal Medicine

## 2013-03-09 ENCOUNTER — Ambulatory Visit: Payer: No Typology Code available for payment source

## 2013-03-09 VITALS — BP 100/60 | HR 69 | Temp 98.0°F | Resp 16 | Ht 61.0 in | Wt 157.0 lb

## 2013-03-09 DIAGNOSIS — M79609 Pain in unspecified limb: Secondary | ICD-10-CM

## 2013-03-09 DIAGNOSIS — M79672 Pain in left foot: Secondary | ICD-10-CM

## 2013-03-09 DIAGNOSIS — M722 Plantar fascial fibromatosis: Secondary | ICD-10-CM

## 2013-03-09 NOTE — [place] Notes (Signed)
Subjective:    Patient ID: Cheryl Booth, female    DOB: June 22, 1965, 47 y.o.   MRN: [id number]  HPI  HPI Comments: Cheryl Booth is a 47 y.o. female who presents to the [facility] complaining of left heel pain onset a few months ago, constant for the last month. Pt states the pain is worst in the morning when she first wakes up. 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 Pt denies a hx of a heel spur to her knowledge.She also reports she experiences pain in her groin when she lifts heavy objects or travels up and down stairs. Pt has a hx of scoliosis.  Shoe change just prior to this as well     Review of Systems  Constitutional: Negative for fever and chills.  Musculoskeletal:       Left heel pain Groin pain       Objective:   Physical Exam BP 100/60  Pulse 69  Temp(Src) 98 F (36.7 C) (Oral)  Resp 16  Ht 5\' 1"  (1.549 m)  Wt 157 lb (71.215 kg)  BMI 29.68 kg/m2  SpO2 97%  LMP 04/22/2005 Tender L heel at insertion of pl fasc mid and medial ach intac Calf int Knee stable Hip allso full rom No groin pain elicited [facility] reading (PRIMARY) by  Dr.Leonora Gores=no heel spur and no fx       Assessment & Plan:  Pain, heel, left - Plan: DG Os Calcis Left  Plantar fasciitis  Patient Instructions  Heel pad Stretching(Prostep 2) ICE 15-20 minutes at end of day Supportive shoesPlantar Fasciitis Plantar fasciitis is a common condition that causes foot pain. It is soreness (inflammation) of the band of tough fibrous tissue on the bottom of the foot that runs from the heel bone (calcaneus) to the ball of the foot. The cause of this soreness may be from excessive standing, poor fitting shoes, running on hard surfaces, being overweight, having an abnormal walk, or overuse (this is common in runners) of the painful foot or feet. It is also common in aerobic exercise dancers and ballet dancers. SYMPTOMS  Most people with plantar fasciitis complain  of:  Severe pain in the morning on the bottom of their foot especially when taking the first steps out of bed. This pain recedes after a few minutes of walking.  Severe pain is experienced also during walking following a long period of inactivity.  Pain is worse when walking barefoot or up stairs DIAGNOSIS   Your caregiver will diagnose this condition by examining and feeling your foot.  Special tests such as X-rays of your foot, are usually not needed. PREVENTION   Consult a sports medicine professional before beginning a new exercise program.  Walking programs offer a good workout. With walking there is a lower chance of overuse injuries common to runners. There is less impact and less jarring of the joints.  Begin all new exercise programs slowly. If problems or pain develop, decrease the amount of time or distance until you are at a comfortable level.  Wear good shoes and replace them regularly.  Stretch your foot and the heel cords at the back of the ankle (Achilles tendon) both before and after exercise.  Run or exercise on even surfaces that are not hard. For example, asphalt is better than pavement.  Do not run barefoot on hard surfaces.  If using a treadmill, vary the incline.  Do not continue to workout  if you have foot or joint problems. Seek professional help if they do not improve. HOME CARE INSTRUCTIONS   Avoid activities that cause you pain until you recover.  Use ice or cold packs on the problem or painful areas after working out.  Only take over-the-counter or prescription medicines for pain, discomfort, or fever as directed by your caregiver.  Soft shoe inserts or athletic shoes with air or gel sole cushions may be helpful.  If problems continue or become more severe, consult a sports medicine caregiver or your own health care provider. Cortisone is a potent anti-inflammatory medication that may be injected into the painful area. You can discuss this  treatment with your caregiver. MAKE SURE YOU:   Understand these instructions.  Will watch your condition.  Will get help right away if you are not doing well or get worse. Document Released: 01/23/2001 Document Revised: 07/23/2011 Document Reviewed: 03/24/2008 ExitCare Patient Information 2014 [place], [place].   call 3-4 weeks if not well for referral to PT for iontophor

## 2013-03-09 NOTE — Patient Instructions (Signed)
Heel pad Stretching(Prostep 2) ICE 15-20 minutes at end of day Supportive shoesPlantar Fasciitis Plantar fasciitis is a common condition that causes foot pain. It is soreness (inflammation) of the band of tough fibrous tissue on the bottom of the foot that runs from the heel bone (calcaneus) to the ball of the foot. The cause of this soreness may be from excessive standing, poor fitting shoes, running on hard surfaces, being overweight, having an abnormal walk, or overuse (this is common in runners) of the painful foot or feet. It is also common in aerobic exercise dancers and ballet dancers. SYMPTOMS  Most people with plantar fasciitis complain of:  Severe pain in the morning on the bottom of their foot especially when taking the first steps out of bed. This pain recedes after a few minutes of walking.  Severe pain is experienced also during walking following a long period of inactivity.  Pain is worse when walking barefoot or up stairs DIAGNOSIS   Your caregiver will diagnose this condition by examining and feeling your foot.  Special tests such as X-rays of your foot, are usually not needed. PREVENTION   Consult a sports medicine professional before beginning a new exercise program.  Walking programs offer a good workout. With walking there is a lower chance of overuse injuries common to runners. There is less impact and less jarring of the joints.  Begin all new exercise programs slowly. If problems or pain develop, decrease the amount of time or distance until you are at a comfortable level.  Wear good shoes and replace them regularly.  Stretch your foot and the heel cords at the back of the ankle (Achilles tendon) both before and after exercise.  Run or exercise on even surfaces that are not hard. For example, asphalt is better than pavement.  Do not run barefoot on hard surfaces.  If using a treadmill, vary the incline.  Do not continue to workout if you have foot or joint  problems. Seek professional help if they do not improve. HOME CARE INSTRUCTIONS   Avoid activities that cause you pain until you recover.  Use ice or cold packs on the problem or painful areas after working out.  Only take over-the-counter or prescription medicines for pain, discomfort, or fever as directed by your caregiver.  Soft shoe inserts or athletic shoes with air or gel sole cushions may be helpful.  If problems continue or become more severe, consult a sports medicine caregiver or your own health care provider. Cortisone is a potent anti-inflammatory medication that may be injected into the painful area. You can discuss this treatment with your caregiver. MAKE SURE YOU:   Understand these instructions.  Will watch your condition.  Will get help right away if you are not doing well or get worse. Document Released: 01/23/2001 Document Revised: 07/23/2011 Document Reviewed: 03/24/2008 [facility] Patient Information 2014 [place], [place].

## 2013-04-03 ENCOUNTER — Ambulatory Visit: Payer: No Typology Code available for payment source

## 2013-04-03 ENCOUNTER — Ambulatory Visit (INDEPENDENT_AMBULATORY_CARE_PROVIDER_SITE_OTHER): Payer: No Typology Code available for payment source | Admitting: Family Medicine

## 2013-04-03 VITALS — BP 106/60 | HR 80 | Temp 98.5°F | Resp 18 | Ht 61.0 in | Wt 159.0 lb

## 2013-04-03 DIAGNOSIS — M79609 Pain in unspecified limb: Secondary | ICD-10-CM

## 2013-04-03 DIAGNOSIS — S93401A Sprain of unspecified ligament of right ankle, initial encounter: Secondary | ICD-10-CM

## 2013-04-03 DIAGNOSIS — S93409A Sprain of unspecified ligament of unspecified ankle, initial encounter: Secondary | ICD-10-CM

## 2013-04-03 NOTE — Patient Instructions (Signed)
Use the CAM boot as needed for support.  Use the crutches as needed.  Ice and elevate your ankle when you can.  If you are not feeling better in the next few days please let me know- Sooner if worse.

## 2013-04-03 NOTE — Progress Notes (Signed)
[facility] [address] [place] [address] [phone]  Date:  04/03/2013   Name:  Cheryl Booth   DOB:  06-Sep-1965   MRN:  [id number]  PCP:  Provider Not In System    Chief Complaint: Foot Injury   History of Present Illness:  Cheryl Booth is a 47 y.o. very pleasant female patient who presents with the following:  She was going down a driveway and stepped on an uneven place yesterday; she sprained her right ankle. Inversion injury.  She notes pain in the lateral ankle and over the dorsal foot.  She has used ice and does not have much swelling.  She is able to walk but is having pain and using a cane that she had at work.   She also has plantar fascitis on the left foot. 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    She is generally healthy She has had the PF for about 2 months now,  She is doing stretches and using gel inserts which help some.  Otherwise she is unhurt and does not have any other concerns today  Patient Active Problem List   Diagnosis Date Noted  . History of colon polyps 04/24/2012  . Hiatal hernia 04/24/2012    Past Medical History  Diagnosis Date  . NSVD (normal spontaneous vaginal delivery)   . Hiatal hernia     Past Surgical History  Procedure Laterality Date  . Dilation and curettage of uterus      x2  . Abdominal hysterectomy      TVH/ A&P REPAIR    History  Substance Use Topics  . Smoking status: Never Smoker   . Smokeless tobacco: Never Used  . Alcohol Use: No    Family History  Problem Relation Age of Onset  . Cancer Maternal Aunt 60    OVARIAN ( DESEASED)  . Cancer Maternal Aunt 60    STOMACH (DESEASED)  . Cancer Maternal Aunt     THYROID (DESEASED)  . Hypertension Mother     Allergies  Allergen Reactions  . Aspirin   . Codeine Nausea And Vomiting  . Hycodan [Hydrocodone-Homatropine] Nausea And Vomiting    Pt called back 07/31/11 and reported that she was  vomiting from the hycodan  . Nsaids     Medication list has been reviewed and updated.  No current outpatient prescriptions on file prior to visit.   No current facility-administered medications on file prior to visit.    Review of Systems:  As per HPI- otherwise negative.   Physical Examination: Filed Vitals:   04/03/13 0925  BP: 106/60  Pulse: 80  Temp: 98.5 F (36.9 C)  Resp: 18   Filed Vitals:   04/03/13 0925  Height: 5\' 1"  (1.549 m)  Weight: 159 lb (72.122 kg)   Body mass index is 30.06 kg/(m^2). Ideal Body Weight: Weight in (lb) to have BMI = 25: 132  GEN: WDWN, NAD, Non-toxic, A & O x 3, overweight HEENT: Atraumatic, Normocephalic. Neck supple. No masses, No LAD. Ears and Nose: No external deformity. CV: RRR, No M/G/R. No JVD. No thrill. No extra heart sounds. PULM: CTA B, no wheezes, crackles, rhonchi. No retractions. No resp. distress. No accessory muscle use. EXTR: No c/c/e NEURO favoring her right foot PSYCH: Normally interactive. Conversant. Not depressed or anxious appearing.  Calm demeanor.  Right ankle and foot: mild ttp over the lateal foot.  Ankle is negative.  Trace swelling of foot, no bruise noted.   Left foot:slightly tender at the heel. PF   [facility] reading (PRIMARY) by  Dr. Patsy Lager Right foot: negative Right ankle: negative  RIGHT ANKLE - COMPLETE 3+ VIEW  COMPARISON: None.  FINDINGS: No fracture or dislocation is seen.  The ankle mortise is intact.  The base of the fifth metatarsal is unremarkable.  The visualized soft tissues are unremarkable.  IMPRESSION: No fracture or dislocation is seen.  RIGHT FOOT COMPLETE - 3+ VIEW  COMPARISON: None.  FINDINGS: No fracture or dislocation is seen.  Mild degenerative changes the 1st MTP joint with hallux valgus deformity and overlying soft tissue bunion.  The visualized soft tissues are unremarkable.  IMPRESSION: Mild degenerative changes with hallux valgus deformity and  overlying soft tissue bunion.  Assessment and Plan: Right ankle sprain, initial encounter - Plan: DG Ankle Complete Right, DG Foot Complete Right  Right ankle sprain; CAM boot and crutches.  She will ice and elevate the foot, and let me know if she is not better in the next few days.    Signed Abbe Amsterdam, MD

## 2013-04-24 ENCOUNTER — Encounter: Payer: Self-pay | Admitting: Gynecology

## 2013-04-27 ENCOUNTER — Other Ambulatory Visit: Payer: Self-pay | Admitting: Gynecology

## 2013-04-27 ENCOUNTER — Encounter: Payer: Self-pay | Admitting: Gynecology

## 2013-04-27 ENCOUNTER — Ambulatory Visit (INDEPENDENT_AMBULATORY_CARE_PROVIDER_SITE_OTHER): Payer: No Typology Code available for payment source | Admitting: Gynecology

## 2013-04-27 VITALS — BP 122/70 | Ht 60.0 in | Wt 157.2 lb

## 2013-04-27 DIAGNOSIS — Z1159 Encounter for screening for other viral diseases: Secondary | ICD-10-CM

## 2013-04-27 DIAGNOSIS — Z01419 Encounter for gynecological examination (general) (routine) without abnormal findings: Secondary | ICD-10-CM

## 2013-04-27 DIAGNOSIS — R635 Abnormal weight gain: Secondary | ICD-10-CM

## 2013-04-27 LAB — CBC WITH DIFFERENTIAL/PLATELET
Basophils Absolute: 0.1 10*3/uL (ref 0.0–0.1)
HCT: 39.4 % (ref 36.0–46.0)
Hemoglobin: 13.6 g/dL (ref 12.0–15.0)
Lymphocytes Relative: 39 % (ref 12–46)
Lymphs Abs: 2.4 10*3/uL (ref 0.7–4.0)
Monocytes Absolute: 0.4 10*3/uL (ref 0.1–1.0)
Monocytes Relative: 6 % (ref 3–12)
Neutro Abs: 3.2 10*3/uL (ref 1.7–7.7)
RBC: 4.57 MIL/uL (ref 3.87–5.11)
WBC: 6.3 10*3/uL (ref 4.0–10.5)

## 2013-04-27 LAB — COMPREHENSIVE METABOLIC PANEL
AST: 22 U/L (ref 0–37)
Albumin: 4.6 g/dL (ref 3.5–5.2)
BUN: 14 mg/dL (ref 6–23)
Calcium: 9.6 mg/dL (ref 8.4–10.5)
Chloride: 103 mEq/L (ref 96–112)
Glucose, Bld: 88 mg/dL (ref 70–99)
Potassium: 4 mEq/L (ref 3.5–5.3)

## 2013-04-27 LAB — URINALYSIS W MICROSCOPIC + REFLEX CULTURE
Bilirubin Urine: NEGATIVE
Casts: NONE SEEN
Crystals: NONE SEEN
Glucose, UA: NEGATIVE mg/dL
Nitrite: NEGATIVE
Specific Gravity, Urine: 1.022 (ref 1.005–1.030)
pH: 5 (ref 5.0–8.0)

## 2013-04-27 LAB — TSH: TSH: 1.018 u[IU]/mL (ref 0.350–4.500)

## 2013-04-27 LAB — LIPID PANEL: HDL: 58 mg/dL (ref >39)

## 2013-04-27 NOTE — Patient Instructions (Signed)
Control del colesterol  Los niveles de colesterol en el organismo estn determinados significativamente por su dieta. Los niveles de colesterol tambin se relacionan con la enfermedad cardaca. El material que sigue ayuda a explicar esta relacin y a analizar qu puede hacer para mantener su corazn sano. No todo el colesterol es malo. Las lipoprotenas de baja densidad (LDL) forman el colesterol "malo". El colesterol malo puede ocasionar depsitos de grasa que se acumulan en el interior de las arterias. Las lipoprotenas de alta densidad (HDL) es el colesterol "bueno". Ayuda a remover el colesterol LDL "malo" de la sangre. El colesterol es un factor de riesgo muy importante para la enfermedad cardaca. Otros factores de riesgo son la hipertensin arterial, el hbito de fumar, el estrs, la herencia y el peso.   El msculo cardaco obtiene el suministro de sangre a travs de las arterias coronarias. Si su colesterol LDL ("malo") est elevado y el HDL ("bueno") es bajo, tiene un factor de riesgo para que se formen depsitos de grasa en las arterias coronarias (los vasos sanguneos que suministran sangre al corazn). Esto hace que haya menos lugar para que la sangre circule. Sin la suficiente sangre y oxgeno, el msculo cardaco no puede funcionar correctamente, y usted podr sentir dolores en el pecho (angina pectoris). Cuando una arteria coronaria se cierra completamente, una parte del msculo cardaco puede morir (infarto de miocardio).  CONTROL DEL COLESTEROL Cuando el profesional que lo asiste enva la sangre al laboratorio para conocer el nivel de colesterol, puede realizarle tambin un perfil completo de los lpidos. Con esta prueba, se puede determinar la cantidad total de colesterol, as como los niveles de LDL y HDL. Los triglicridos son un tipo de grasa que circula en la sangre y que tambin puede utilizarse para determinar el riesgo de enfermedad  cardaca. En la siguiente tabla se establecen los nmeros ideales: Prueba: Colesterol total  Menos de 200 mg/dl.  Prueba: LDL "colesterol malo"  Menos de 100 mg/dl.   Menos de 70 mg/dl si tiene riesgo muy elevado de sufrir un ataque cardaco o muerte cardaca sbita.  Prueba: HDL "colesterol bueno"  Mujeres: Ms de 50 mg/dl.   Hombres: Ms de 40 mg/dl.  Prueba: Trigliceridos  Menos de 150 mg/dl.    CONTROL DEL COLESTEROL CON DIETA Aunque factores como el ejercicio y el estilo de vida son importantes, la "primera lnea de ataque" es la dieta. Esto se debe a que se sabe que ciertos alimentos hacen subir el colesterol y otros lo bajan. El objetivo debe ser equilibrar los alimentos, de modo que tengan un efecto sobre el colesterol y, an ms importante, reemplazar las grasas saturadas y trans con otros tipos de grasas, como las monoinsaturadas y las poliinsaturadas y cidos grasos omega-3 . En promedio, una persona no debe consumir ms de 15 a 17 g de grasas saturadas por da. Las grasas saturadas y trans se consideran grasas "malas", ya que elevan el colesterol LDL. Las grasas saturadas se encuentran principalmente en productos animales como carne, manteca y crema. Pero esto no significa que usted debe sacrificar todas sus comidas favoritas. Actualmente, como lo muestra el cuadro que figura al final de este documento, hay sustitutos de buen sabor, bajos en grasas y en colesterol, para la mayora de los alimentos que a usted le gusta comer. Elija aquellos alimentos alternativos que sean bajos en grasas o sin grasas. Elija cortes de carne del cuarto trasero o lomo ya que estos cortes son los que tienen menor cantidad de grasa   y colesterol. El pollo (sin piel), el pescado, la carne de ternera, y la pechuga de pavo molida son excelentes opciones. Elimine las carnes grasosas como los hotdogs o el salami. Los mariscos tienen poco o nada de grasas saturadas. Cuando consuma carne magra, carne de aves de  corral, o pescado, hgalo en porciones de 85 gramos (3 onzas). Las grasas trans tambin se llaman "aceites parcialmente hidrogenados". Son aceites manipulados cientficamente de modo que son slidos a temperatura ambiente, tienen una larga vida y mejoran el sabor y la textura de los alimentos a los que se agregan. Las grasas trans se encuentran en la margarina, masitas, crackers y alimentos horneados.  Para hornear y cocinar, el aceite es un excelente sustituto para la mantequilla. Los aceites monoinsaturados tienen un beneficio particular, ya que se cree que disminuyen el colesterol LDL (colesterol malo) y elevan el HDL. Deber evitar los aceites tropicales saturados como el de coco y el de palma.  Recuerde, adems, que puede comer sin restricciones los grupos de alimentos que son naturalmente libres de grasas saturadas y grasas trans, entre los que se incluyen el pescado, las frutas (excepto el aguacate), verduras, frijoles, cereales (cebada, arroz, cuzcuz, trigo) y las pastas (sin salsas con crema)   IDENTIFIQUE LOS ALIMENTOS QUE DISMINUYEN EL COLESTEROL  Pueden disminuir el colesterol las fibras solubles que estn en las frutas, como las manzanas, en los vegetales como el brcoli, las patatas y las zanahorias; en las legumbres como frijoles, guisantes y lentejas; y en los cereales como la cebada. Los alimentos fortificados con fitosteroles tambin pueden disminuir el colesterol. Debe consumir al menos 2 g de estos alimentos a diario para obtener el efecto de disminucin de colesterol.  En el supermercado, lea las etiquetas de los envases para identificar los alimentos bajos en grasas saturadas, libres de grasas trans y bajos en grasas, . Elija quesos que tengan solo de 2 a 3 g de grasa saturada por onza (28,35 g). Use una margarina que no dae el corazn, libre de grasas trans o aceite parcialmente hidrogenado. Al comprar alimentos horneados (galletitas dulces y galletas) evite el aceite parcialmente  hidrogenado. Los panes y bollos debern ser de granos enteros (harina de maz o de avena entera, en lugar de "harina" o "harina enriquecida"). Compre sopas en lata que no sean cremosas, con bajo contenido de sal y sin grasas adicionadas.   TCNICAS DE PREPARACIN DE LOS ALIMENTOS  Nunca fra los alimentos en aceite abundante. Si debe frer, hgalo en poco aceite y removiendo constantemente, porque as se utilizan muy pocas grasas, o utilice un spray antiadherente. Cuando le sea posible, hierva, hornee o ase las carnes y cocine los vegetales al vapor. En vez de aderezar los vegetales con mantequilla o margarina, utilice limn y hierbas, pur de manzanas y canela (para las calabazas y batatas), yogurt y salsa descremados y aderezos para ensaladas bajos en contenido graso.   BAJO EN GRASAS SATURADAS / SUSTITUTOS BAJOS EN GRASA  Carnes / Grasas saturadas (g)  Evite: Bife, corte graso (3 oz/85 g) / 11 g   Elija: Bife, corte magro (3 oz/85 g) / 4 g   Evite: Hamburguesa (3 oz/85 g) / 7 g   Elija:  Hamburguesa magra (3 oz/85 g) / 5 g   Evite: Jamn (3 oz/85 g) / 6 g   Elija:  Jamn magro (3 oz/85 g) / 2.4 g   Evite: Pollo, con piel (3 oz/85 g), Carne oscura / 4 g   Elija:  Pollo, sin piel (  3 oz/85 g), Carne oscura / 2 g   Evite: Pollo, con piel (3 oz/85 g), Carne magra / 2.5 g   Elija: Pollo, sin piel (3 oz/85 g), Carne magra / 1 g  Lcteos / Grasas saturadas (g)  Evite: Leche entera (1 taza) / 5 g   Elija: Leche con bajo contenido de grasa, 2% (1 taza) / 3 g   Elija: Leche con bajo contenido de grasa, 1% (1 taza) / 1.5 g   Elija: Leche descremada (1 taza) / 0.3 g   Evite: Queso duro (1 oz/28 g) / 6 g   Elija: Queso descremado (1 oz/28 g) / 2-3 g   Evite: Queso cottage, 4% grasa (1 taza)/ 6.5 g   Elija: Queso cottage con bajo contenido de grasa, 1% grasa (1 taza)/ 1.5 g   Evite: Helado (1 taza) / 9 g   Elija: Sorbete (1 taza) / 2.5 g   Elija: Yogurt helado sin contenido de  grasa (1 taza) / 0.3 g   Elija: Barras de fruta congeladas / vestigios   Evite: Crema batida (1 cucharada) / 3.5 g   Elija: Batidos glac sin lcteos (1 cucharada) / 1 g  Condimentos / Grasas saturadas (g)  Evite: Mayonesa (1 cucharada) / 2 g   Elija: Mayonesa con bajo contenido de grasa (1 cucharada) / 1 g   Evite: Manteca (1 cucharada) / 7 g   Elija: Margarina extra light (1 cucharada) / 1 g   Evite: Aceite de coco (1 cucharada) / 11.8 g   Elija: Aceite de oliva (1 cucharada) / 1.8 g   Elija: Aceite de maz (1 cucharada) / 1.7 g   Elija: Aceite de crtamo (1 cucharada) / 1.2 g   Elija: Aceite de girasol (1 cucharada) / 1.4 g   Elija: Aceite de soja (1 cucharada) / 2.4 g   Elija: Aceite de canola (1 cucharada) / 1 g  Document Released: 04/30/2005 Document Revised: 01/10/2011 [employer] Patient Information 2012 [employer]. Ejercicios para perder peso (Exercise to Lose Weight) La actividad fsica y una dieta saludable ayudan a perder peso. El mdico podr sugerirle ejercicios especficos. IDEAS Y CONSEJOS PARA HACER EJERCICIOS  Elija opciones econmicas que disfrute hacer , como caminar, andar en bicicleta o los vdeos para ejercitarse.   Utilice las escaleras en lugar del ascensor.   Camine durante la hora del almuerzo.   Estacione el auto lejos del lugar de trabajo o estudio.   Concurra a un gimnasio o tome clases de gimnasia.   Comience con 5  10 minutos de actividad fsica por da. Ejercite hasta 30 minutos, 4 a 6 das por semana.   Utilice zapatos que tengan un buen soporte y ropas cmodas.   Elongue antes y despus de ejercitar.   Ejercite hasta que aumente la respiracin y el corazn palpite rpido.   Beba agua extra cuando ejercite.   No haga ejercicio hasta lastimarse, sentirse mareado o que le falte mucho el aire.  La actividad fsica puede quemar alrededor de 150 caloras.  Correr 20 cuadras en 15 minutos.   Jugar vley durante 45 a 60  minutos.   Limpiar y encerar el auto durante 45 a 60 minutos.   Jugar ftbol americano de toque.   Caminar 25 cuadras en 35 minutos.   Empujar un cochecito 20 cuadras en 30 minutos.   Jugar baloncesto durante 30 minutos.   Rastrillar hojas secas durante 30 minutos.   Andar en bicicleta 80 cuadras en 30 minutos.     Caminar 30 cuadras en 30 minutos.   Bailar durante 30 minutos.   Quitar la nieve con una pala durante 15 minutos.   Nadar vigorosamente durante 20 minutos.   Subir escaleras durante 15 minutos.   Andar en bicicleta 60 cuadras durante 15 minutos.   Arreglar el jardn entre 30 y 45 minutos.   Saltar a la soga durante 15 minutos.   Limpiar vidrios o pisos durante 45 a 60 minutos.  Document Released: 08/04/2010 Document Revised: 01/10/2011 [employer] Patient Information 2012 [employer]. 

## 2013-04-27 NOTE — Progress Notes (Signed)
Cheryl Booth 12-23-1965 [id number]   History:    47 y.o.  for annual gyn exam with no complaints today. Review of her record indicated she was weighing 158 last year and his weight 157. Patient with prior history transvaginal hysterectomy with anterior and posterior colporrhaphy. Review of patient's records indicated 2011 she had resection of colon polyp which was benign. Patient with no prior history of abnormal Pap smears. Patient flu vaccine up-to-date. Patient with a normal mammogram this year. Patient is fasting today for blood work.   Past medical history,surgical history, family history and social history were all reviewed and documented in the EPIC chart.  Gynecologic History Patient's last menstrual period was 04/22/2005. Contraception: status post hysterectomy Last Pap: 2012. Results were: normal Last mammogram: 2014. Results were: normal  Obstetric History OB History  Gravida Para Term Preterm AB SAB TAB Ectopic Multiple Living  2 1 1  1 1    1     # Outcome Date GA Lbr Len/2nd Weight Sex Delivery Anes PTL Lv  2 SAB           1 TRM     M SVD  N Y       ROS: A ROS was performed and pertinent positives and negatives are included in the history.  GENERAL: No fevers or chills. HEENT: No change in vision, no earache, sore throat or sinus congestion. NECK: No pain or stiffness. CARDIOVASCULAR: No chest pain or pressure. No palpitations. PULMONARY: No shortness of breath, cough or wheeze. GASTROINTESTINAL: No abdominal pain, nausea, vomiting or diarrhea, melena or bright red blood per rectum. GENITOURINARY: No urinary frequency, urgency, hesitancy or dysuria. MUSCULOSKELETAL: No joint or muscle pain, no back pain, no recent trauma. DERMATOLOGIC: No rash, no itching, no lesions. ENDOCRINE: No polyuria, polydipsia, no heat or cold intolerance. No recent change in weight. HEMATOLOGICAL: No anemia or easy bruising or bleeding. NEUROLOGIC: No headache, seizures, numbness, tingling or  weakness. PSYCHIATRIC: No depression, no loss of interest in normal activity or change in sleep pattern.     Exam: chaperone present  BP 122/70  Ht 5' (1.524 m)  Wt 157 lb 3.2 oz (71.305 kg)  BMI 30.70 kg/m2  LMP 04/22/2005  Body mass index is 30.7 kg/(m^2).  General appearance : Well developed well nourished female. No acute distress HEENT: Neck supple, trachea midline, no carotid bruits, no thyroidmegaly Lungs: Clear to auscultation, no rhonchi or wheezes, or rib retractions  Heart: Regular rate and rhythm, no murmurs or gallops Breast:Examined in sitting and supine position were symmetrical in appearance, no palpable masses or tenderness,  no skin retraction, no nipple inversion, no nipple discharge, no skin discoloration, no axillary or supraclavicular lymphadenopathy Abdomen: no palpable masses or tenderness, no rebound or guarding Extremities: no edema or skin discoloration or tenderness  Pelvic:  Bartholin, Urethra, Skene Glands: Within normal limits             Vagina: No gross lesions or discharge  Cervix: absent  Uterus Absent Adnexa  Without masses or tenderness  Anus and perineum  normal   Rectovaginal  normal sphincter tone without palpated masses or tenderness             Hemoccult provided due to her past history of benign colon polyp in 2011   Assessment/Plan:  47 y.o. female for annual exam who was doing well. We discussed importance of regular exercise as well as calcium and vitamin D for osteoporosis prevention. Pap smear was done today  along with the following labs: Fasting lipid profile, comprehensive metabolic panel, TSH, and urinalysis. Patient was given Hemoccult cards to submit to the office for testing.   New CDC guidelines is recommending patients be tested once in her lifetime for hepatitis C antibody who were born between [birth year] through 1967. This was discussed with the patient today and has agreed to be tested today.  Note: This dictation was prepared  with  Dragon/digital dictation along withSmart phrase technology. Any transcriptional errors that result from this process are unintentional.   Ok Edwards MD, 1:24 PM 04/27/2013

## 2013-04-28 ENCOUNTER — Other Ambulatory Visit: Payer: Self-pay | Admitting: Gynecology

## 2013-04-28 DIAGNOSIS — E7800 Pure hypercholesterolemia, unspecified: Secondary | ICD-10-CM

## 2013-04-28 LAB — HEPATITIS C ANTIBODY: HCV Ab: NEGATIVE

## 2014-03-15 ENCOUNTER — Encounter: Payer: Self-pay | Admitting: Gynecology

## 2014-04-27 ENCOUNTER — Encounter: Payer: Self-pay | Admitting: Gynecology

## 2014-04-28 ENCOUNTER — Ambulatory Visit (INDEPENDENT_AMBULATORY_CARE_PROVIDER_SITE_OTHER): Payer: BC Managed Care – PPO | Admitting: Gynecology

## 2014-04-28 ENCOUNTER — Encounter: Payer: Self-pay | Admitting: Gynecology

## 2014-04-28 VITALS — BP 130/84 | Ht 61.0 in | Wt 160.0 lb

## 2014-04-28 DIAGNOSIS — Z01419 Encounter for gynecological examination (general) (routine) without abnormal findings: Secondary | ICD-10-CM

## 2014-04-28 DIAGNOSIS — Z23 Encounter for immunization: Secondary | ICD-10-CM

## 2014-04-28 LAB — COMPREHENSIVE METABOLIC PANEL
ALK PHOS: 75 U/L (ref 39–117)
ALT: 35 U/L (ref 0–35)
AST: 29 U/L (ref 0–37)
Albumin: 4.4 g/dL (ref 3.5–5.2)
BUN: 15 mg/dL (ref 6–23)
CO2: 28 mEq/L (ref 19–32)
Calcium: 9.7 mg/dL (ref 8.4–10.5)
Chloride: 101 mEq/L (ref 96–112)
Creat: 0.71 mg/dL (ref 0.50–1.10)
Glucose, Bld: 86 mg/dL (ref 70–99)
POTASSIUM: 4 meq/L (ref 3.5–5.3)
SODIUM: 136 meq/L (ref 135–145)
TOTAL PROTEIN: 7.6 g/dL (ref 6.0–8.3)
Total Bilirubin: 0.6 mg/dL (ref 0.2–1.2)

## 2014-04-28 LAB — CBC WITH DIFFERENTIAL/PLATELET
BASOS PCT: 0 % (ref 0–1)
Basophils Absolute: 0 10*3/uL (ref 0.0–0.1)
Eosinophils Absolute: 0.2 10*3/uL (ref 0.0–0.7)
Eosinophils Relative: 3 % (ref 0–5)
HEMATOCRIT: 39.7 % (ref 36.0–46.0)
HEMOGLOBIN: 13.5 g/dL (ref 12.0–15.0)
LYMPHS ABS: 2.5 10*3/uL (ref 0.7–4.0)
Lymphocytes Relative: 35 % (ref 12–46)
MCH: 29.2 pg (ref 26.0–34.0)
MCHC: 34 g/dL (ref 30.0–36.0)
MCV: 85.7 fL (ref 78.0–100.0)
MPV: 9.2 fL — ABNORMAL LOW (ref 9.4–12.4)
Monocytes Absolute: 0.4 10*3/uL (ref 0.1–1.0)
Monocytes Relative: 6 % (ref 3–12)
NEUTROS ABS: 3.9 10*3/uL (ref 1.7–7.7)
NEUTROS PCT: 56 % (ref 43–77)
Platelets: 305 10*3/uL (ref 150–400)
RBC: 4.63 MIL/uL (ref 3.87–5.11)
RDW: 12.7 % (ref 11.5–15.5)
WBC: 7 10*3/uL (ref 4.0–10.5)

## 2014-04-28 LAB — LIPID PANEL
Cholesterol: 217 mg/dL — ABNORMAL HIGH (ref 0–200)
HDL: 52 mg/dL (ref >39)
LDL CALC: 138 mg/dL — AB (ref 0–99)
Total CHOL/HDL Ratio: 4.2 Ratio
Triglycerides: 134 mg/dL (ref <150)
VLDL: 27 mg/dL (ref 0–40)

## 2014-04-28 NOTE — Progress Notes (Signed)
Zollie PeeMaria T Hoaglin Sep 08, 1965 [id number]   History:    48 y.o.  for annual exam. Patient asymptomatic. Patient with past history of transvaginal hysterectomy with anterior and posterior colporrhaphy has done well. In 2011 patient had resection of colon polyp which was benign. No past history of any abnormal Pap smears before her hysterectomy or after. Patient requesting flu vaccine today. Last year patient was tested for hepatitis C according to the CDC guidelines and was negative. Patient with no vasomotor symptoms and currently on no hormone.  Past medical history,surgical history, family history and social history were all reviewed and documented in the EPIC chart.  Gynecologic History Patient's last menstrual period was 04/22/2005. Contraception: status post hysterectomy Last Pap: 2012. Results were: normal Last mammogram: 2015. Results were: normal  Obstetric History OB History  Gravida Para Term Preterm AB SAB TAB Ectopic Multiple Living  2 1 1  1 1    1     # Outcome Date GA Lbr Len/2nd Weight Sex Delivery Anes PTL Lv  2 SAB           1 Term     M Vag-Spont  N Y       ROS: A ROS was performed and pertinent positives and negatives are included in the history.  GENERAL: No fevers or chills. HEENT: No change in vision, no earache, sore throat or sinus congestion. NECK: No pain or stiffness. CARDIOVASCULAR: No chest pain or pressure. No palpitations. PULMONARY: No shortness of breath, cough or wheeze. GASTROINTESTINAL: No abdominal pain, nausea, vomiting or diarrhea, melena or bright red blood per rectum. GENITOURINARY: No urinary frequency, urgency, hesitancy or dysuria. MUSCULOSKELETAL: No joint or muscle pain, no back pain, no recent trauma. DERMATOLOGIC: No rash, no itching, no lesions. ENDOCRINE: No polyuria, polydipsia, no heat or cold intolerance. No recent change in weight. HEMATOLOGICAL: No anemia or easy bruising or bleeding. NEUROLOGIC: No headache, seizures, numbness, tingling  or weakness. PSYCHIATRIC: No depression, no loss of interest in normal activity or change in sleep pattern.     Exam: chaperone present  BP 130/84 mmHg  Ht 5\' 1"  (1.549 m)  Wt 160 lb (72.576 kg)  BMI 30.25 kg/m2  LMP 04/22/2005  Body mass index is 30.25 kg/(m^2).  General appearance : Well developed well nourished female. No acute distress HEENT: Neck supple, trachea midline, no carotid bruits, no thyroidmegaly Lungs: Clear to auscultation, no rhonchi or wheezes, or rib retractions  Heart: Regular rate and rhythm, no murmurs or gallops Breast:Examined in sitting and supine position were symmetrical in appearance, no palpable masses or tenderness,  no skin retraction, no nipple inversion, no nipple discharge, no skin discoloration, no axillary or supraclavicular lymphadenopathy Abdomen: no palpable masses or tenderness, no rebound or guarding Extremities: no edema or skin discoloration or tenderness  Pelvic:  Bartholin, Urethra, Skene Glands: Within normal limits             Vagina: No gross lesions or discharge  Cervix: Absent  Uterus absent  Adnexa  Without masses or tenderness  Anus and perineum  normal   Rectovaginal  normal sphincter tone without palpated masses or tenderness             Hemoccult cards will be provided today     Assessment/Plan:  48 y.o. female for annual exam who was reminded on the importance of monthly breast exams as well as calcium and vitamin D and exercise for osteoporosis prevention. Pap smear was not done today according to the  new guidelines. The following labs were ordered: CBC, fasting lipid profile, TSH, comprehensive metabolic panel, and urinalysis. She was provided with fecal Hemoccult cards to submit to the office for testing. Patient received the flu vaccine today.   Ok EdwardsFERNANDEZ,Aralyn Nowak H MD, 10:14 AM 04/28/2014

## 2014-04-28 NOTE — Patient Instructions (Signed)
Influenza Virus Vaccine injection (Fluarix) Qu es este medicamento? La VACUNA ANTIGRIPAL ayuda a disminuir el riesgo de contraer la influenza, tambin conocida como la gripe. La vacuna solo ayuda a protegerle contra algunas cepas de influenza. Esta vacuna no ayuda a reducir el riesgo de contraer influenza pandmica H1N1. Este medicamento puede ser utilizado para otros usos; si tiene alguna pregunta consulte con su proveedor de atencin mdica o con su farmacutico. MARCAS COMERCIALES DISPONIBLES: Fluarix, Fluzone Qu le debo informar a mi profesional de la salud antes de tomar este medicamento? Necesita saber si usted presenta alguno de los siguientes problemas o situaciones: -trastorno de sangrado como hemofilia -fiebre o infeccin -sndrome de Guillain-Barre u otros problemas neurolgicos -problemas del sistema inmunolgico -infeccin por el virus de la inmunodeficiencia humana (VIH) o SIDA -niveles bajos de plaquetas en la sangre -esclerosis mltiple -una reaccin alrgica o inusual a las vacunas antigripales, a los huevos, protenas de pollo, al ltex, a la gentamicina, a otros medicamentos, alimentos, colorantes o conservantes -si est embarazada o buscando quedar embarazada -si est amamantando a un beb Cmo debo utilizar este medicamento? Esta vacuna se administra mediante inyeccin por va intramuscular. Lo administra un profesional de la salud. Recibir una copia de informacin escrita sobre la vacuna antes de cada vacuna. Asegrese de leer este folleto cada vez cuidadosamente. Este folleto puede cambiar con frecuencia. Hable con su pediatra para informarse acerca del uso de este medicamento en nios. Puede requerir atencin especial. Sobredosis: Pngase en contacto inmediatamente con un centro toxicolgico o una sala de urgencia si usted cree que haya tomado demasiado medicamento. ATENCIN: Este medicamento es solo para usted. No comparta este medicamento con nadie. Qu sucede  si me olvido de una dosis? No se aplica en este caso. Qu puede interactuar con este medicamento? -quimioterapia o radioterapia -medicamentos que suprimen el sistema inmunolgico, tales como etanercept, anakinra, infliximab y adalimumab -medicamentos que tratan o previenen cogulos sanguneos, como warfarina -fenitona -medicamentos esteroideos, como la prednisona o la cortisona -teofilina -vacunas Puede ser que esta lista no menciona todas las posibles interacciones. Informe a su profesional de la salud de todos los productos a base de hierbas, medicamentos de venta libre o suplementos nutritivos que est tomando. Si usted fuma, consume bebidas alcohlicas o si utiliza drogas ilegales, indqueselo tambin a su profesional de la salud. Algunas sustancias pueden interactuar con su medicamento. A qu debo estar atento al usar este medicamento? Informe a su mdico o a su profesional de la salud sobre todos los efectos secundarios que persistan despus de 3 das. Llame a su proveedor de atencin mdica si se presentan sntomas inusuales dentro de las 6 semanas posteriores a la vacunacin. Es posible que todava pueda contraer la gripe, pero la enfermedad no ser tan fuerte como normalmente. No puede contraer la gripe de esta vacuna. La vacuna antigripal no le protege contra resfros u otras enfermedades que pueden causar fiebre. Debe vacunarse cada ao. Qu efectos secundarios puedo tener al utilizar este medicamento? Efectos secundarios que debe informar a su mdico o a su profesional de la salud tan pronto como sea posible: -reacciones alrgicas como erupcin cutnea, picazn o urticarias, hinchazn de la cara, labios o lengua Efectos secundarios que, por lo general, no requieren atencin mdica (debe informarlos a su mdico o a su profesional de la salud si persisten o si son molestos): -fiebre -dolor de cabeza -molestias y dolores musculares -dolor, sensibilidad, enrojecimiento o hinchazn en  el lugar de la inyeccin -cansancio o debilidad Puede ser que esta lista   no menciona todos los posibles efectos secundarios. Comunquese a su mdico por asesoramiento mdico sobre los efectos secundarios. Usted puede informar los efectos secundarios a la FDA por telfono al 1-800-FDA-1088. Dnde debo guardar mi medicina? Esta vacuna se administra solamente en clnicas, farmacias, consultorio mdico u otro consultorio de un profesional de la salud y no necesitar guardarlo en su domicilio. ATENCIN: Este folleto es un resumen. Puede ser que no cubra toda la posible informacin. Si usted tiene preguntas acerca de esta medicina, consulte con su mdico, su farmacutico o su profesional de la salud.  2015, [employer]. (2009-11-01 15:31:40)  

## 2014-04-29 LAB — URINALYSIS W MICROSCOPIC + REFLEX CULTURE
Bilirubin Urine: NEGATIVE
Casts: NONE SEEN
Crystals: NONE SEEN
Glucose, UA: NEGATIVE mg/dL
Ketones, ur: NEGATIVE mg/dL
NITRITE: NEGATIVE
Protein, ur: NEGATIVE mg/dL
Specific Gravity, Urine: 1.024 (ref 1.005–1.030)
Urobilinogen, UA: 0.2 mg/dL (ref 0.0–1.0)
pH: 5.5 (ref 5.0–8.0)

## 2014-04-29 LAB — TSH: TSH: 1.163 u[IU]/mL (ref 0.350–4.500)

## 2014-05-01 LAB — URINE CULTURE: Colony Count: >=100000

## 2014-05-03 ENCOUNTER — Other Ambulatory Visit: Payer: Self-pay | Admitting: Gynecology

## 2014-05-03 MED ORDER — LEVOFLOXACIN 250 MG PO TABS: 250.0000 mg | ORAL_TABLET | Freq: Two times a day (BID) | ORAL | Status: DC

## 2014-05-03 MED ORDER — SULFAMETHOXAZOLE-TRIMETHOPRIM 800-160 MG PO TABS: 1.0000 | ORAL_TABLET | Freq: Two times a day (BID) | ORAL | Status: DC

## 2014-05-20 ENCOUNTER — Encounter: Payer: Self-pay | Admitting: Gynecology

## 2014-05-20 ENCOUNTER — Ambulatory Visit (INDEPENDENT_AMBULATORY_CARE_PROVIDER_SITE_OTHER): Payer: BLUE CROSS/BLUE SHIELD | Admitting: Gynecology

## 2014-05-20 VITALS — BP 120/80

## 2014-05-20 DIAGNOSIS — N3001 Acute cystitis with hematuria: Secondary | ICD-10-CM

## 2014-05-20 LAB — URINALYSIS W MICROSCOPIC + REFLEX CULTURE
BILIRUBIN URINE: NEGATIVE
CASTS: NONE SEEN
Crystals: NONE SEEN
GLUCOSE, UA: NEGATIVE mg/dL
KETONES UR: NEGATIVE mg/dL
Nitrite: NEGATIVE
PH: 6 (ref 5.0–8.0)
Protein, ur: NEGATIVE mg/dL
SPECIFIC GRAVITY, URINE: 1.02 (ref 1.005–1.030)
Urobilinogen, UA: 0.2 mg/dL (ref 0.0–1.0)

## 2014-05-20 MED ORDER — LEVOFLOXACIN 750 MG PO TABS: 750.0000 mg | ORAL_TABLET | Freq: Every day | ORAL | Status: DC

## 2014-05-20 NOTE — Addendum Note (Signed)
Addended by: Berna SpareASTILLO, Dallis Czaja A on: 05/20/2014 09:24 AM   Modules accepted: Orders

## 2014-05-20 NOTE — Progress Notes (Signed)
    Patient presented to the office today to discuss her urinalysis. This was done as a test of cure because when patient was seen in the office on December 16 for her annual exam her urinalysis had demonstrated the following:  Results for Cheryl Booth, [name] (MRN [id number]) as of 05/20/2014 08:58  Ref. Range 04/28/2014 09:27  Color, Urine Latest Range: YELLOW  YELLOW  APPearance Latest Range: CLEAR  CLEAR  Specific Gravity, Urine Latest Range: 1.005-1.030  1.024  pH Latest Range: 5.0-8.0  5.5  Glucose Latest Range: NEG mg/dL NEG  Bilirubin Urine Latest Range: NEG  NEG  Ketones, ur Latest Range: NEG mg/dL NEG  Protein Latest Range: NEG mg/dL NEG  Urobilinogen, UA Latest Range: 0.0-1.0 mg/dL 0.2  Nitrite Latest Range: NEG  NEG  Leukocytes, UA Latest Range: NEG  MOD (A)  Hgb urine dipstick Latest Range: NEG  MOD (A)  WBC, UA Latest Range: <3 WBC/hpf 11-20 (A)  RBC / HPF Latest Range: <3 RBC/hpf 0-2  Squamous Epithelial / LPF Latest Range: RARE  FEW  Bacteria, UA Latest Range: RARE  MANY (A)  Crystals Latest Range: NONE SEEN  NONE SEEN  Casts Latest Range: NONE SEEN  NONE SEEN   She was started on Septra DS 1 by mouth twice a day for 7 days but when we receive the results of her culture and sensitivity the microorganism identified was pseudomonas aeruginosa Which was resistant to this antibiotic. Based on the following sensitivity panel: Colony Count >=100,000 COLONIES/ML   Organism ID, Bacteria PSEUDOMONAS AERUGINOSA   Resulting Agency [facility]    Culture & Susceptibility      PSEUDOMONAS AERUGINOSA     Antibiotic Sensitivity Microscan Status    CEFEPIME Sensitive 4 Final    CEFTAZIDIME Sensitive 4 Final    CIPROFLOXACIN Sensitive <=0.25 Final    GENTAMICIN Sensitive <=1 Final    IMIPENEM Sensitive 1 Final    LEVOFLOXACIN Sensitive 1 Final    PIP/TAZO Sensitive 8 Final    TOBRAMYCIN Sensitive <=1 Final      She was started on Levaquin 250 mg twice a day for 3  days. She is asymptomatic today. Her urinalysis today demonstrated the following: WBC: 30-6 RBC: 11-20 Bacteria: Many  Based on the above sensitivity panel she's going to be placed on Levaquin 750 mg one by mouth daily for 5 days. She will return to the office next week for urinalysis for test of cure.

## 2014-05-21 ENCOUNTER — Telehealth: Payer: Self-pay | Admitting: *Deleted

## 2014-05-21 ENCOUNTER — Other Ambulatory Visit: Payer: Self-pay | Admitting: Gynecology

## 2014-05-21 DIAGNOSIS — R3129 Other microscopic hematuria: Secondary | ICD-10-CM

## 2014-05-21 LAB — URINE CULTURE
Colony Count: NO GROWTH
ORGANISM ID, BACTERIA: NO GROWTH

## 2014-05-21 NOTE — Telephone Encounter (Signed)
Tell her to discontinue the medication. Her urine culture showed no growth. Have her come back in one week for follow-up urinalysis only without office visit

## 2014-05-21 NOTE — Telephone Encounter (Signed)
Pt was precribed Levaquin 750 mg one by mouth daily for 5 days on 05/20/13 OV. Pt said medication causing some joint pain, also got lightheaded.pt asked if she should cut in half? Was given levquin 250 mg in past twice daily but they didn't help with the infection. Please advise

## 2014-05-21 NOTE — Telephone Encounter (Signed)
Pt informed with the below note, pt will come back for repeat u/a

## 2014-05-31 DIAGNOSIS — Z1211 Encounter for screening for malignant neoplasm of colon: Secondary | ICD-10-CM

## 2014-06-01 ENCOUNTER — Other Ambulatory Visit: Payer: BLUE CROSS/BLUE SHIELD

## 2014-06-01 ENCOUNTER — Other Ambulatory Visit: Payer: BLUE CROSS/BLUE SHIELD | Admitting: Anesthesiology

## 2014-06-01 DIAGNOSIS — Z1211 Encounter for screening for malignant neoplasm of colon: Secondary | ICD-10-CM

## 2014-06-01 DIAGNOSIS — N3001 Acute cystitis with hematuria: Secondary | ICD-10-CM

## 2014-06-02 LAB — URINALYSIS W MICROSCOPIC + REFLEX CULTURE
Bacteria, UA: NONE SEEN
Bilirubin Urine: NEGATIVE
Casts: NONE SEEN
Glucose, UA: NEGATIVE mg/dL
Hgb urine dipstick: NEGATIVE
Ketones, ur: NEGATIVE mg/dL
LEUKOCYTES UA: NEGATIVE
NITRITE: NEGATIVE
PROTEIN: NEGATIVE mg/dL
SPECIFIC GRAVITY, URINE: 1.02 (ref 1.005–1.030)
SQUAMOUS EPITHELIAL / LPF: NONE SEEN
UROBILINOGEN UA: 0.2 mg/dL (ref 0.0–1.0)
pH: 7 (ref 5.0–8.0)

## 2014-07-05 ENCOUNTER — Ambulatory Visit (INDEPENDENT_AMBULATORY_CARE_PROVIDER_SITE_OTHER): Payer: BLUE CROSS/BLUE SHIELD | Admitting: Family Medicine

## 2014-07-05 ENCOUNTER — Ambulatory Visit (INDEPENDENT_AMBULATORY_CARE_PROVIDER_SITE_OTHER): Payer: BLUE CROSS/BLUE SHIELD

## 2014-07-05 VITALS — BP 122/74 | HR 85 | Temp 98.2°F | Resp 17 | Ht 60.0 in | Wt 162.0 lb

## 2014-07-05 DIAGNOSIS — R0789 Other chest pain: Secondary | ICD-10-CM

## 2014-07-05 DIAGNOSIS — M549 Dorsalgia, unspecified: Secondary | ICD-10-CM

## 2014-07-05 DIAGNOSIS — M62830 Muscle spasm of back: Secondary | ICD-10-CM

## 2014-07-05 DIAGNOSIS — M4124 Other idiopathic scoliosis, thoracic region: Secondary | ICD-10-CM

## 2014-07-05 DIAGNOSIS — M419 Scoliosis, unspecified: Secondary | ICD-10-CM

## 2014-07-05 LAB — COMPREHENSIVE METABOLIC PANEL
ALBUMIN: 4.7 g/dL (ref 3.5–5.2)
ALK PHOS: 74 U/L (ref 39–117)
ALT: 41 U/L — ABNORMAL HIGH (ref 0–35)
AST: 29 U/L (ref 0–37)
BUN: 16 mg/dL (ref 6–23)
CHLORIDE: 103 meq/L (ref 96–112)
CO2: 25 meq/L (ref 19–32)
Calcium: 10.1 mg/dL (ref 8.4–10.5)
Creat: 0.71 mg/dL (ref 0.50–1.10)
GLUCOSE: 115 mg/dL — AB (ref 70–99)
POTASSIUM: 4.4 meq/L (ref 3.5–5.3)
Sodium: 139 mEq/L (ref 135–145)
TOTAL PROTEIN: 8 g/dL (ref 6.0–8.3)
Total Bilirubin: 0.7 mg/dL (ref 0.2–1.2)

## 2014-07-05 LAB — POCT CBC
Granulocyte percent: 60.3 %G (ref 37–80)
HCT, POC: 42.6 % (ref 37.7–47.9)
HEMOGLOBIN: 13.8 g/dL (ref 12.2–16.2)
Lymph, poc: 2.9 (ref 0.6–3.4)
MCH, POC: 28.9 pg (ref 27–31.2)
MCHC: 32.4 g/dL (ref 31.8–35.4)
MCV: 89.2 fL (ref 80–97)
MID (CBC): 0.6 (ref 0–0.9)
MPV: 6.7 fL (ref 0–99.8)
POC Granulocyte: 5.2 (ref 2–6.9)
POC LYMPH %: 32.9 % (ref 10–50)
POC MID %: 6.8 %M (ref 0–12)
Platelet Count, POC: 334 10*3/uL (ref 142–424)
RBC: 4.77 M/uL (ref 4.04–5.48)
RDW, POC: 12.4 %
WBC: 8.7 10*3/uL (ref 4.6–10.2)

## 2014-07-05 LAB — POCT SEDIMENTATION RATE: POCT SED RATE: 18 mm/h (ref 0–22)

## 2014-07-05 MED ORDER — METAXALONE 800 MG PO TABS: 800.0000 mg | ORAL_TABLET | Freq: Three times a day (TID) | ORAL | Status: DC

## 2014-07-05 NOTE — Progress Notes (Signed)
Subjective: 49 year old lady who is here complaining of right back pain. It's been bothering her for about a week. She is works despite this. It started last Wednesday which is actually 5 days ago. She woke up with the pain. It has been fairly constant. It hurts from morning to the day, but lets up a little bit late in the afternoon. It feels like a spasm like a knife like a something being in there. It hurts a little worse when she moves her neck sometimes. She has no sore throat or cough or fever or shortness of breath or nausea or vomiting or abdominal pain. There is no tenderness to touch and no positional difference. Knows of no injuries. Has not had a pain like this before.  Objective: Throat clear. Neck supple. Chest clear. Heart regular without murmurs. Chest wall nontender. No rashes. Abdomen soft without organomegaly masses or tenderness. Range of motion good. Range of motion shoulder motion good.  Assessment: Atypical right mid back to chest pain, etiology undetermined  Plan: Chest x-ray, dorsal spine, and CBC, sedimentation rate, C met  [facility] reading (PRIMARY) by  Dr. Linna Darner Mild scoliosis thoracic spine.  Otherwise no obvious problems  Results for orders placed or performed in visit on 07/05/14  POCT CBC  Result Value Ref Range   WBC 8.7 4.6 - 10.2 K/uL   Lymph, poc 2.9 0.6 - 3.4   POC LYMPH PERCENT 32.9 10 - 50 %L   MID (cbc) 0.6 0 - 0.9   POC MID % 6.8 0 - 12 %M   POC Granulocyte 5.2 2 - 6.9   Granulocyte percent 60.3 37 - 80 %G   RBC 4.77 4.04 - 5.48 M/uL   Hemoglobin 13.8 12.2 - 16.2 g/dL   HCT, POC 42.6 37.7 - 47.9 %   MCV 89.2 80 - 97 fL   MCH, POC 28.9 27 - 31.2 pg   MCHC 32.4 31.8 - 35.4 g/dL   RDW, POC 12.4 %   Platelet Count, POC 334 142 - 424 K/uL   MPV 6.7 0 - 99.8 fL   .  Patient is intolerant to many pain medications, history of NSAID anaphylaxis.  Treat with Tylenol and Skelaxin. If she is not improving will need to refer to back person because of the  scoliosis

## 2014-07-05 NOTE — Patient Instructions (Signed)
Take Skelaxin (metaxalone) one pill every 8 hours as needed for muscle accident  Take Tylenol 2 pills every 6-8 hours if needed for pain  We will let you know the results of your additional labs in a few days.  If pain continues to persist without another explanation we will send you to a back specialist to look at your scoliosis and see if that could be causing it.

## 2014-07-08 ENCOUNTER — Emergency Department (HOSPITAL_COMMUNITY)
Admission: EM | Admit: 2014-07-08 | Discharge: 2014-07-08 | Disposition: A | Discharge status: Home / Self Care | Payer: BLUE CROSS/BLUE SHIELD | Attending: Emergency Medicine | Admitting: Emergency Medicine

## 2014-07-08 ENCOUNTER — Emergency Department (HOSPITAL_COMMUNITY): Payer: BLUE CROSS/BLUE SHIELD

## 2014-07-08 ENCOUNTER — Encounter (HOSPITAL_COMMUNITY): Payer: Self-pay | Admitting: Emergency Medicine

## 2014-07-08 DIAGNOSIS — K2100 Gastro-esophageal reflux disease with esophagitis, without bleeding: Secondary | ICD-10-CM

## 2014-07-08 DIAGNOSIS — K2970 Gastritis, unspecified, without bleeding: Secondary | ICD-10-CM | POA: Diagnosis not present

## 2014-07-08 DIAGNOSIS — R0602 Shortness of breath: Secondary | ICD-10-CM | POA: Diagnosis not present

## 2014-07-08 DIAGNOSIS — M546 Pain in thoracic spine: Secondary | ICD-10-CM | POA: Diagnosis not present

## 2014-07-08 DIAGNOSIS — R1310 Dysphagia, unspecified: Secondary | ICD-10-CM | POA: Diagnosis not present

## 2014-07-08 DIAGNOSIS — R42 Dizziness and giddiness: Secondary | ICD-10-CM | POA: Diagnosis not present

## 2014-07-08 DIAGNOSIS — Z79899 Other long term (current) drug therapy: Secondary | ICD-10-CM | POA: Insufficient documentation

## 2014-07-08 DIAGNOSIS — Z90710 Acquired absence of both cervix and uterus: Secondary | ICD-10-CM | POA: Diagnosis not present

## 2014-07-08 DIAGNOSIS — R1013 Epigastric pain: Secondary | ICD-10-CM

## 2014-07-08 LAB — CBC WITH DIFFERENTIAL/PLATELET
Basophils Absolute: 0 10*3/uL (ref 0.0–0.1)
Basophils Relative: 1 % (ref 0–1)
EOS PCT: 1 % (ref 0–5)
Eosinophils Absolute: 0.1 10*3/uL (ref 0.0–0.7)
HEMATOCRIT: 40.8 % (ref 36.0–46.0)
HEMOGLOBIN: 13.5 g/dL (ref 12.0–15.0)
LYMPHS PCT: 29 % (ref 12–46)
Lymphs Abs: 2.5 10*3/uL (ref 0.7–4.0)
MCH: 29.4 pg (ref 26.0–34.0)
MCHC: 33.1 g/dL (ref 30.0–36.0)
MCV: 88.9 fL (ref 78.0–100.0)
MONO ABS: 0.6 10*3/uL (ref 0.1–1.0)
Monocytes Relative: 7 % (ref 3–12)
NEUTROS ABS: 5.4 10*3/uL (ref 1.7–7.7)
Neutrophils Relative %: 62 % (ref 43–77)
Platelets: 305 10*3/uL (ref 150–400)
RBC: 4.59 MIL/uL (ref 3.87–5.11)
RDW: 12 % (ref 11.5–15.5)
WBC: 8.5 10*3/uL (ref 4.0–10.5)

## 2014-07-08 LAB — COMPREHENSIVE METABOLIC PANEL
ALK PHOS: 73 U/L (ref 39–117)
ALT: 44 U/L — ABNORMAL HIGH (ref 0–35)
AST: 33 U/L (ref 0–37)
Albumin: 4.8 g/dL (ref 3.5–5.2)
Anion gap: 9 (ref 5–15)
BILIRUBIN TOTAL: 0.5 mg/dL (ref 0.3–1.2)
BUN: 10 mg/dL (ref 6–23)
CHLORIDE: 104 mmol/L (ref 96–112)
CO2: 25 mmol/L (ref 19–32)
Calcium: 10.2 mg/dL (ref 8.4–10.5)
Creatinine, Ser: 0.58 mg/dL (ref 0.50–1.10)
GFR calc Af Amer: >90 mL/min (ref >90)
GFR calc non Af Amer: >90 mL/min (ref >90)
Glucose, Bld: 97 mg/dL (ref 70–99)
POTASSIUM: 4.1 mmol/L (ref 3.5–5.1)
SODIUM: 138 mmol/L (ref 135–145)
Total Protein: 8.4 g/dL — ABNORMAL HIGH (ref 6.0–8.3)

## 2014-07-08 LAB — LIPASE, BLOOD: LIPASE: 25 U/L (ref 11–59)

## 2014-07-08 LAB — I-STAT TROPONIN, ED: Troponin i, poc: 0.01 ng/mL (ref 0.00–0.08)

## 2014-07-08 MED ORDER — OMEPRAZOLE 20 MG PO CPDR: 20.0000 mg | DELAYED_RELEASE_CAPSULE | Freq: Every day | ORAL | Status: DC

## 2014-07-08 MED ORDER — GI COCKTAIL ~~LOC~~
30.0000 mL | Freq: Once | ORAL | Status: AC
  Administered 2014-07-08: 30 mL via ORAL
  Filled 2014-07-08: qty 30

## 2014-07-08 NOTE — Discharge Instructions (Signed)
Your pain was evaluated here and there are no emergent sources found, but it's likely related to gastritis or indigestion/reflux. Use tylenol as needed for pain. Start taking prilosec daily, and use maalox as needed for the feeling of esophagus pressure when eating. Follow up with your gastroenterologist for ongoing management and care. Stay well hydrated. Return to the ER for changes or worsening symptoms.  Abdominal (belly) pain can be caused by many things. Your caregiver performed an examination and possibly ordered blood/urine tests and imaging (CT scan, x-rays, ultrasound). Many cases can be observed and treated at home after initial evaluation in the emergency department. Even though you are being discharged home, abdominal pain can be unpredictable. Therefore, you need a repeated exam if your pain does not resolve, returns, or worsens. Most patients with abdominal pain don't have to be admitted to the hospital or have surgery, but serious problems like appendicitis and gallbladder attacks can start out as nonspecific pain. Many abdominal conditions cannot be diagnosed in one visit, so follow-up evaluations are very important. SEEK IMMEDIATE MEDICAL ATTENTION IF YOU DEVELOP ANY OF THE FOLLOWING SYMPTOMS:  The pain does not go away or becomes severe.   A temperature above 101 develops.   Repeated vomiting occurs (multiple episodes).   The pain becomes localized to portions of the abdomen. The right side could possibly be appendicitis. In an adult, the left lower portion of the abdomen could be colitis or diverticulitis.   Blood is being passed in stools or vomit (bright red or black tarry stools).   Return also if you develop chest pain, difficulty breathing, dizziness or fainting, or become confused, poorly responsive, or inconsolable (young children).  The constipation stays for more than 4 days.   There is belly (abdominal) or rectal pain.   You do not seem to be getting better.      Dysphagia Swallowing problems (dysphagia) occur when solids and liquids seem to stick in your throat on the way down to your stomach, or the food takes longer to get to the stomach. Other symptoms include regurgitating food, noises coming from the throat, chest discomfort with swallowing, and a feeling of fullness or the feeling of something being stuck in your throat when swallowing. When blockage in your throat is complete, it may be associated with drooling. CAUSES  Problems with swallowing may occur because of problems with the muscles. The food cannot be propelled in the usual manner into your stomach. You may have ulcers, scar tissue, or inflammation in the tube down which food travels from your mouth to your stomach (esophagus), which blocks food from passing normally into the stomach. Causes of inflammation include:  Acid reflux from your stomach into your esophagus.  Infection.  Radiation treatment for cancer.  Medicines taken without enough fluids to wash them down into your stomach. You may have nerve problems that prevent signals from being sent to the muscles of your esophagus to contract and move your food down to your stomach. Globus pharyngeus is a relatively common problem in which there is a sense of an obstruction or difficulty in swallowing, without any physical abnormalities of the swallowing passages being found. This problem usually improves over time with reassurance and testing to rule out other causes. DIAGNOSIS Dysphagia can be diagnosed and its cause can be determined by tests in which you swallow a white substance that helps illuminate the inside of your throat (contrast medium) while X-rays are taken. Sometimes a flexible telescope that is inserted down your  throat (endoscopy) to look at your esophagus and stomach is used. TREATMENT   If the dysphagia is caused by acid reflux or infection, medicines may be used.  If the dysphagia is caused by problems with your  swallowing muscles, swallowing therapy may be used to help you strengthen your swallowing muscles.  If the dysphagia is caused by a blockage or mass, procedures to remove the blockage may be done. HOME CARE INSTRUCTIONS  Try to eat soft food that is easier to swallow and check your weight on a daily basis to be sure that it is not decreasing.  Be sure to drink liquids when sitting upright (not lying down). SEEK MEDICAL CARE IF:  You are losing weight because you are unable to swallow.  You are coughing when you drink liquids (aspiration).  You are coughing up partially digested food. SEEK IMMEDIATE MEDICAL CARE IF:  You are unable to swallow your own saliva .  You are having shortness of breath or a fever, or both.  You have a hoarse voice along with difficulty swallowing. MAKE SURE YOU:  Understand these instructions.  Will watch your condition.  Will get help right away if you are not doing well or get worse. Document Released: 04/27/2000 Document Revised: 09/14/2013 Document Reviewed: 10/17/2012 [facility] Patient Information 2015 [place], [place]. This information is not intended to replace advice given to you by your health care provider. Make sure you discuss any questions you have with your health care provider.  Esophagitis Esophagitis is inflammation of the esophagus. It can involve swelling, soreness, and pain in the esophagus. This condition can make it difficult and painful to swallow. CAUSES  Most causes of esophagitis are not serious. Many different factors can cause esophagitis, including:  Gastroesophageal reflux disease (GERD). This is when acid from your stomach flows up into the esophagus.  Recurrent vomiting.  An allergic-type reaction.  Certain medicines, especially those that come in large pills.  Ingestion of harmful chemicals, such as household cleaning products.  Heavy alcohol use.  An infection of the esophagus.  Radiation treatment for  cancer.  Certain diseases such as sarcoidosis, Crohn's disease, and scleroderma. These diseases may cause recurrent esophagitis. SYMPTOMS   Trouble swallowing.  Painful swallowing.  Chest pain.  Difficulty breathing.  Nausea.  Vomiting.  Abdominal pain. DIAGNOSIS  Your caregiver will take your history and do a physical exam. Depending upon what your caregiver finds, certain tests may also be done, including:  Barium X-ray. You will drink a solution that coats the esophagus, and X-rays will be taken.  Endoscopy. A lighted tube is put down the esophagus so your caregiver can examine the area.  Allergy tests. These can sometimes be arranged through follow-up visits. TREATMENT  Treatment will depend on the cause of your esophagitis. In some cases, steroids or other medicines may be given to help relieve your symptoms or to treat the underlying cause of your condition. Medicines that may be recommended include:  Viscous lidocaine, to soothe the esophagus.  Antacids.  Acid reducers.  Proton pump inhibitors.  Antiviral medicines for certain viral infections of the esophagus.  Antifungal medicines for certain fungal infections of the esophagus.  Antibiotic medicines, depending on the cause of the esophagitis. HOME CARE INSTRUCTIONS   Avoid foods and drinks that seem to make your symptoms worse.  Eat small, frequent meals instead of large meals.  Avoid eating for the 3 hours prior to your bedtime.  If you have trouble taking pills, use a pill splitter  to decrease the size and likelihood of the pill getting stuck or injuring the esophagus on the way down. Drinking water after taking a pill also helps.  Stop smoking if you smoke.  Maintain a healthy weight.  Wear loose-fitting clothing. Do not wear anything tight around your waist that causes pressure on your stomach.  Raise the head of your bed 6 to 8 inches with wood blocks to help you sleep. Extra pillows will not  help.  Only take over-the-counter or prescription medicines as directed by your caregiver. SEEK IMMEDIATE MEDICAL CARE IF:  You have severe chest pain that radiates into your arm, neck, or jaw.  You feel sweaty, dizzy, or lightheaded.  You have shortness of breath.  You vomit blood.  You have difficulty or pain with swallowing.  You have bloody or black, tarry stools.  You have a fever.  You have a burning sensation in the chest more than 3 times a week for more than 2 weeks.  You cannot swallow, drink, or eat.  You drool because you cannot swallow your saliva. MAKE SURE YOU:  Understand these instructions.  Will watch your condition.  Will get help right away if you are not doing well or get worse. Document Released: 06/07/2004 Document Revised: 07/23/2011 Document Reviewed: 12/29/2010 [facility] Patient Information 2015 [place]. This information is not intended to replace advice given to you by your health care provider. Make sure you discuss any questions you have with your health care provider.  Gastritis, Adult Gastritis is soreness and swelling (inflammation) of the lining of the stomach. Gastritis can develop as a sudden onset (acute) or long-term (chronic) condition. If gastritis is not treated, it can lead to stomach bleeding and ulcers. CAUSES  Gastritis occurs when the stomach lining is weak or damaged. Digestive juices from the stomach then inflame the weakened stomach lining. The stomach lining may be weak or damaged due to viral or bacterial infections. One common bacterial infection is the Helicobacter pylori infection. Gastritis can also result from excessive alcohol consumption, taking certain medicines, or having too much acid in the stomach.  SYMPTOMS  In some cases, there are no symptoms. When symptoms are present, they may include:  Pain or a burning sensation in the upper abdomen.  Nausea.  Vomiting.  An uncomfortable feeling of fullness  after eating. DIAGNOSIS  Your caregiver may suspect you have gastritis based on your symptoms and a physical exam. To determine the cause of your gastritis, your caregiver may perform the following:  Blood or stool tests to check for the H pylori bacterium.  Gastroscopy. A thin, flexible tube (endoscope) is passed down the esophagus and into the stomach. The endoscope has a light and camera on the end. Your caregiver uses the endoscope to view the inside of the stomach.  Taking a tissue sample (biopsy) from the stomach to examine under a microscope. TREATMENT  Depending on the cause of your gastritis, medicines may be prescribed. If you have a bacterial infection, such as an H pylori infection, antibiotics may be given. If your gastritis is caused by too much acid in the stomach, H2 blockers or antacids may be given. Your caregiver may recommend that you stop taking aspirin, ibuprofen, or other nonsteroidal anti-inflammatory drugs (NSAIDs). HOME CARE INSTRUCTIONS  Only take over-the-counter or prescription medicines as directed by your caregiver.  If you were given antibiotic medicines, take them as directed. Finish them even if you start to feel better.  Drink enough fluids to keep  your urine clear or pale yellow.  Avoid foods and drinks that make your symptoms worse, such as:  Caffeine or alcoholic drinks.  Chocolate.  Peppermint or mint flavorings.  Garlic and onions.  Spicy foods.  Citrus fruits, such as oranges, lemons, or limes.  Tomato-based foods such as sauce, chili, salsa, and pizza.  Fried and fatty foods.  Eat small, frequent meals instead of large meals. SEEK IMMEDIATE MEDICAL CARE IF:   You have black or dark red stools.  You vomit blood or material that looks like coffee grounds.  You are unable to keep fluids down.  Your abdominal pain gets worse.  You have a fever.  You do not feel better after 1 week.  You have any other questions or  concerns. MAKE SURE YOU:  Understand these instructions.  Will watch your condition.  Will get help right away if you are not doing well or get worse. Document Released: 04/24/2001 Document Revised: 10/30/2011 Document Reviewed: 06/13/2011 [facility] Patient Information 2015 [place], [place]. This information is not intended to replace advice given to you by your health care provider. Make sure you discuss any questions you have with your health care provider.  Food Choices for Gastroesophageal Reflux Disease When you have gastroesophageal reflux disease (GERD), the foods you eat and your eating habits are very important. Choosing the right foods can help ease your discomfort.  WHAT GUIDELINES DO I NEED TO FOLLOW?   Choose fruits, vegetables, whole grains, and low-fat dairy products.   Choose low-fat meat, fish, and poultry.  Limit fats such as oils, salad dressings, butter, nuts, and avocado.   Keep a food diary. This helps you identify foods that cause symptoms.   Avoid foods that cause symptoms. These may be different for everyone.   Eat small meals often instead of 3 large meals a day.   Eat your meals slowly, in a place where you are relaxed.   Limit fried foods.   Cook foods using methods other than frying.   Avoid drinking alcohol.   Avoid drinking large amounts of liquids with your meals.   Avoid bending over or lying down until 2-3 hours after eating.  WHAT FOODS ARE NOT RECOMMENDED?  These are some foods and drinks that may make your symptoms worse: Vegetables Tomatoes. Tomato juice. Tomato and spaghetti sauce. Chili peppers. Onion and garlic. Horseradish. Fruits Oranges, grapefruit, and lemon (fruit and juice). Meats High-fat meats, fish, and poultry. This includes hot dogs, ribs, ham, sausage, salami, and bacon. Dairy Whole milk and chocolate milk. Sour cream. Cream. Butter. Ice cream. Cream cheese.  Drinks Coffee and tea. Bubbly (carbonated)  drinks or energy drinks. Condiments Hot sauce. Barbecue sauce.  Sweets/Desserts Chocolate and cocoa. Donuts. Peppermint and spearmint. Fats and Oils High-fat foods. This includes [place] fries and potato chips. Other Vinegar. Strong spices. This includes black pepper, white pepper, red pepper, cayenne, curry powder, cloves, ginger, and chili powder. The items listed above may not be a complete list of foods and drinks to avoid. Contact your dietitian for more information. Document Released: 10/30/2011 Document Revised: 05/05/2013 Document Reviewed: 03/04/2013 [facility] Patient Information 2015 [place], [place]. This information is not intended to replace advice given to you by your health care provider. Make sure you discuss any questions you have with your health care provider.  Shortness of Breath Shortness of breath means you have trouble breathing. Shortness of breath needs medical care right away. HOME CARE   Do not smoke.  Avoid being around chemicals or  things (paint fumes, dust) that may bother your breathing.  Rest as needed. Slowly begin your normal activities.  Only take medicines as told by your doctor.  Keep all doctor visits as told. GET HELP RIGHT AWAY IF:   Your shortness of breath gets worse.  You feel lightheaded, pass out (faint), or have a cough that is not helped by medicine.  You cough up blood.  You have pain with breathing.  You have pain in your chest, arms, shoulders, or belly (abdomen).  You have a fever.  You cannot walk up stairs or exercise the way you normally do.  You do not get better in the time expected.  You have a hard time doing normal activities even with rest.  You have problems with your medicines.  You have any new symptoms. MAKE SURE YOU:  Understand these instructions.  Will watch your condition.  Will get help right away if you are not doing well or get worse. Document Released: 10/17/2007 Document Revised: 05/05/2013  Document Reviewed: 07/16/2011 [facility] Patient Information 2015 [place], [place]. This information is not intended to replace advice given to you by your health care provider. Make sure you discuss any questions you have with your health care provider.

## 2014-07-08 NOTE — ED Notes (Signed)
Pt reports SOB starting Monday. Went to local emergency department x2 days-did CXR and no abnormal results were found. C/o back pain x8-9 days where "my bra is." Reports it is "sharp pain going all the way to my breasts. I was drinking water and now I feel like I have something stuck in my throat and it's like my saliva won't pass." Denies dysuria/chest pain/nausea/vomiting.  No other complaints/concerns. In NAD. RR even/unlabored. Able to speak full/clear sentences.

## 2014-07-08 NOTE — ED Notes (Addendum)
Pt reports mid back pain radiating to right side for a week. Pt reports took tylenol at 1000 with some relief. Pt continues to reports "feels like a cannot swallow because it feels like food is stuck in my stomach; like a ball is there." Pt denies CP but reports SOB.

## 2014-07-08 NOTE — ED Provider Notes (Signed)
CSN: [id number]     Arrival date & time 07/08/14  1218 History   First MD Initiated Contact with Patient 07/08/14 1304     Chief Complaint  Patient presents with  . Shortness of Breath     (Consider location/radiation/quality/duration/timing/severity/associated sxs/prior Treatment) HPI Comments: Cheryl Booth is a 49 y.o. female with a PMHx of hiatal hernia and abdominal hysterectomy, who presents to the ED with complaints of epigastric pressure and shortness of breath that began today associated with a sensation of dysphasia stating that it feels like "things get stuck" when she swallows. She also reports that she has had midthoracic back pain 1 week for which she saw [facility] on 07/05/14 and had a negative chest x-ray and labs and was sent home with Tylenol and Skelaxin which has helped some. She reports his back pain is overall improving, but occasionally will radiate around the left side of her back and to the midaxillary line on the right side. She reports the epigastric discomfort is 5/10 pressure-like constant beginning today, radiating somewhat up into her chest, worse with eating, and relieved somewhat with Tylenol. Her shortness of breath also started today, and she has not tried any occasions or found any aggravating factors for this symptom. She also reports that occasionally she gets lightheaded when she stands up intermittently, although she denies that this is ongoing. She denies any fevers, chills, sore throat, tongue or lip swelling, cough, hemoptysis, wheezing, leg swelling, claudication, orthopnea, PND, recent travel/surgery/mobilization, estrogen use, family or personal history of DVT/PE, nausea, vomiting, diarrhea, constipation, melena, hematochezia, dysuria, hematuria, vaginal bleeding or discharge, headaches, vertigo, syncope, vision changes, numbness, tingling, weakness, cauda equina symptoms, alcohol or NSAID use, or hx of CA. Allergic to NSAIDs or any salicylate  derivative. Nonsmoker. No other medical problems aside from hiatal hernia for which she states she had an EGD many years ago.   Patient is a 49 y.o. female presenting with shortness of breath. The history is provided by the patient. No language interpreter was used.  Shortness of Breath Severity:  Mild Onset quality:  Gradual Duration:  1 day Timing:  Constant Progression:  Unchanged Chronicity:  New Context comment:  Eating Relieved by:  None tried Worsened by:  Nothing tried Ineffective treatments:  None tried Associated symptoms: no abdominal pain, no chest pain, no claudication, no cough, no diaphoresis, no fever, no headaches, no hemoptysis, no neck pain, no PND, no rash, no sputum production, no syncope, no vomiting and no wheezing   Risk factors: no family hx of DVT, no hx of cancer, no hx of PE/DVT, no oral contraceptive use, no prolonged immobilization, no recent surgery and no tobacco use     Past Medical History  Diagnosis Date  . NSVD (normal spontaneous vaginal delivery)   . Hiatal hernia    Past Surgical History  Procedure Laterality Date  . Dilation and curettage of uterus      x2  . Abdominal hysterectomy      TVH/ A&P REPAIR   Family History  Problem Relation Age of Onset  . Cancer Maternal Aunt 60    OVARIAN ( DESEASED)  . Cancer Maternal Aunt 60    STOMACH (DESEASED)  . Cancer Maternal Aunt     THYROID (DESEASED)  . Hypertension Mother    History  Substance Use Topics  . Smoking status: Never Smoker   . Smokeless tobacco: Never Used  . Alcohol Use: No   OB History    Gravida Para  Term Preterm AB TAB SAB Ectopic Multiple Living   2 1 1  1  1   1      Review of Systems  Constitutional: Negative for fever, chills and diaphoresis.  Eyes: Negative for visual disturbance.  Respiratory: Positive for shortness of breath. Negative for cough, hemoptysis, sputum production and wheezing.   Cardiovascular: Negative for chest pain, claudication, syncope and  PND.  Gastrointestinal: Negative for nausea, vomiting, abdominal pain, diarrhea, constipation and blood in stool.       +dysphagia ("feels like food gets stuck")  Genitourinary: Negative for dysuria, hematuria, flank pain, vaginal bleeding and vaginal discharge.  Musculoskeletal: Positive for back pain (mid-thoracic and somewhat R sided). Negative for myalgias, arthralgias and neck pain.  Skin: Negative for color change and rash.  Allergic/Immunologic: Negative for immunocompromised state.  Neurological: Positive for light-headedness (intermittently with standing, none at this time). Negative for syncope, weakness, numbness and headaches.  Psychiatric/Behavioral: Negative for confusion.   10 Systems reviewed and are negative for acute change except as noted in the HPI.    Allergies  Aspirin; Nsaids; Codeine; and Hycodan  Home Medications   Prior to Admission medications   Medication Sig Start Date End Date Taking? Authorizing Provider  acetaminophen (TYLENOL) 325 MG tablet Take 650 mg by mouth every 6 (six) hours as needed for moderate pain or headache.    Yes Historical Provider, MD  Bisacodyl (LAXATIVE PO) Take 1 each by mouth once.   Yes Historical Provider, MD  Digestive Enzymes (DIGESTIVE ENZYME PO) Take 1 each by mouth once.   Yes Historical Provider, MD  metaxalone (SKELAXIN) 800 MG tablet Take 1 tablet (800 mg total) by mouth 3 (three) times daily. 07/05/14  Yes Peyton Najjaravid H Hopper, MD   BP 131/78 mmHg  Pulse 80  Temp(Src) 98.1 F (36.7 C) (Oral)  Resp 18  SpO2 97%  LMP 04/22/2005 Physical Exam  Constitutional: She is oriented to person, place, and time. Vital signs are normal. She appears well-developed and well-nourished.  Non-toxic appearance. No distress.  Afebrile, nontoxic, NAD  HENT:  Head: Normocephalic and atraumatic.  Mouth/Throat: Oropharynx is clear and moist and mucous membranes are normal.  Eyes: Conjunctivae and EOM are normal. Right eye exhibits no discharge.  Left eye exhibits no discharge.  Neck: Normal range of motion. Neck supple. No JVD present. No spinous process tenderness and no muscular tenderness present. No rigidity. Normal range of motion present.  Cardiovascular: Normal rate, regular rhythm, normal heart sounds and intact distal pulses.  Exam reveals no gallop and no friction rub.   No murmur heard. RRR, nl s1/s2, no m/r/g, distal pulses intact, no pedal edema   Pulmonary/Chest: Effort normal and breath sounds normal. No respiratory distress. She has no decreased breath sounds. She has no wheezes. She has no rhonchi. She has no rales. She exhibits no tenderness, no crepitus and no retraction.  CTAB in all lung fields, no w/r/r, no hypoxia or increased WOB, speaking in full sentences, SpO2 100% on RA  No chest wall TTP, no crepitus or deformity, no retraction  Abdominal: Soft. Normal appearance and bowel sounds are normal. She exhibits no distension. There is tenderness in the epigastric area. There is no rigidity, no rebound, no guarding, no CVA tenderness, no tenderness at McBurney's point and negative Murphy's sign.    Soft, ND, +BS throughout, with mild epigastric TTP, no r/g/r, neg murphy's, neg mcburney's, no CVA TTP   Musculoskeletal: Normal range of motion.  All spinal levels with FROM intact  without spinous process TTP, no bony stepoffs or deformities, no paraspinous muscle TTP or muscle spasms. Strength 5/5 in all extremities, sensation grossly intact in all extremities, negative SLR bilaterally, gait steady and nonanalgic. No overlying skin changes. Unable to reproduce back pain on exam.  Neurological: She is alert and oriented to person, place, and time. She has normal strength. No sensory deficit.  Skin: Skin is warm, dry and intact. No rash noted.  Psychiatric: She has a normal mood and affect.  Nursing note and vitals reviewed.   ED Course  Procedures (including critical care time) Labs Review Labs Reviewed  COMPREHENSIVE  METABOLIC PANEL - Abnormal; Notable for the following:    Total Protein 8.4 (*)    ALT 44 (*)    All other components within normal limits  CBC WITH DIFFERENTIAL/PLATELET  LIPASE, BLOOD  I-STAT TROPOININ, ED    Imaging Review Dg Abd Acute W/chest  07/08/2014   CLINICAL DATA:  Epigastric abdominal pain.  EXAM: ACUTE ABDOMEN SERIES (ABDOMEN 2 VIEW & CHEST 1 VIEW)  COMPARISON:  None.  FINDINGS: There is no evidence of dilated bowel loops or free intraperitoneal air. No radiopaque calculi or other significant radiographic abnormality is seen. Heart size and mediastinal contours are within normal limits. Both lungs are clear.  IMPRESSION: Negative abdominal radiographs.  No acute cardiopulmonary disease.   Electronically Signed   By: Lupita Raider, M.D.   On: 07/08/2014 14:56     EKG Interpretation   Date/Time:  Thursday July 08 2014 12:23:35 EST Ventricular Rate:  83 PR Interval:  119 QRS Duration: 88 QT Interval:  363 QTC Calculation: 426 R Axis:   71 Text Interpretation:  Sinus rhythm Borderline short PR interval Low  voltage, precordial leads Baseline wander in lead(s) V2 V3 V4 V5 V6 No  significant change since last tracing Confirmed by WARD,  DO, KRISTEN  [phone]) on 07/08/2014 12:35:08 PM      MDM   Final diagnoses:  Epigastric pain  Dysphagia  Gastritis  Esophagitis, reflux  SOB (shortness of breath)    49 y.o. female with mid back pain x1wk, seen 07/05/14 by [facility], CXR clear, labs showed mildly elevated ALT but otherwise WNL, thoracic xray showed some scoliosis. Today she's reporting she has epigastric "pressure" that makes her feel like she can't swallow, although she's tolerating food/fluids well. Hx of hiatal hernia therefore could be related to this. No RUQ tenderness or murphy's sign although gallbladder pathology is on the differential. Also reports SOB that began today. Also feels lightheaded occasionally when she stands but isn't ongoing at this  time. No tachycardia, CP, or hypoxia, doubt DVT/PE. Pt unable to take NSAIDs, will try GI cocktail and obtain repeat labs. No CVA TTP or urinary symptoms, doubt nephrolithiasis although this is on the differential. Could be esophagitis vs gastritis. Will check labs and acute abd series and then reassess. Pt declines pain meds at this time. Will reassess shortly.   3:16 PM Trop neg. CBC w/diff unremarkable, CMP with ALT 44 c/w prior lab result. Lipase WNL. EKG relatively unremarkable, unchanged from prior. Acute abd series unremarkable. Pain completely resolved after GI cocktail, feels much better and tolerating PO well without difficulty. Reports some gas pain but no ongoing epigastric discomfort. Urine never sent, but since I doubt this will change management, will discontinue. Will have her f/up with her GI specialist for ongoing management, this seems like gastritis/estophagitis/GERD vs PUD vs possible gallbladder pathology but neg murphy's and labs WNL,  doubt need for emergent imaging. Will d/c home with prilosec, discussed use of tylenol at home and maalox PRN. Discussed diet modifications for GERD/gastritis. I explained the diagnosis and have given explicit precautions to return to the ER including for any other new or worsening symptoms. The patient understands and accepts the medical plan as it's been dictated and I have answered their questions. Discharge instructions concerning home care and prescriptions have been given. The patient is STABLE and is discharged to home in good condition.  BP 119/71 mmHg  Pulse 60  Temp(Src) 98.1 F (36.7 C) (Oral)  Resp 16  SpO2 97%  LMP 04/22/2005  Meds ordered this encounter  Medications  . gi cocktail (Maalox,Lidocaine,Donnatal)    Sig:   . omeprazole (PRILOSEC) 20 MG capsule    Sig: Take 1 capsule (20 mg total) by mouth daily.    Dispense:  30 capsule    Refill:  0    Order Specific Question:  Supervising Provider    Answer:  Eber Hong D  [[id number]]      Donnita Falls Camprubi-Soms, PA-C 07/08/14 1524  Layla Maw Ward, DO 07/08/14 1530

## 2014-07-13 ENCOUNTER — Other Ambulatory Visit: Payer: Self-pay | Admitting: Gastroenterology

## 2014-07-13 ENCOUNTER — Ambulatory Visit (HOSPITAL_COMMUNITY)
Admission: RE | Admit: 2014-07-13 | Discharge: 2014-07-13 | Disposition: A | Discharge status: Home / Self Care | Payer: BLUE CROSS/BLUE SHIELD | Source: Ambulatory Visit | Attending: Gastroenterology | Admitting: Gastroenterology

## 2014-07-13 DIAGNOSIS — R1310 Dysphagia, unspecified: Secondary | ICD-10-CM | POA: Diagnosis present

## 2015-03-23 ENCOUNTER — Encounter: Payer: Self-pay | Admitting: Women's Health

## 2015-03-23 ENCOUNTER — Ambulatory Visit (INDEPENDENT_AMBULATORY_CARE_PROVIDER_SITE_OTHER): Payer: BLUE CROSS/BLUE SHIELD | Admitting: Women's Health

## 2015-03-23 VITALS — BP 130/80 | Ht 60.0 in | Wt 162.0 lb

## 2015-03-23 DIAGNOSIS — N3001 Acute cystitis with hematuria: Secondary | ICD-10-CM | POA: Diagnosis not present

## 2015-03-23 DIAGNOSIS — R350 Frequency of micturition: Secondary | ICD-10-CM | POA: Diagnosis not present

## 2015-03-23 DIAGNOSIS — N898 Other specified noninflammatory disorders of vagina: Secondary | ICD-10-CM | POA: Diagnosis not present

## 2015-03-23 LAB — URINALYSIS W MICROSCOPIC + REFLEX CULTURE
Bilirubin Urine: NEGATIVE
Casts: NONE SEEN [LPF]
Crystals: NONE SEEN [HPF]
Glucose, UA: NEGATIVE
Ketones, ur: NEGATIVE
NITRITE: NEGATIVE
PH: 5 (ref 5.0–8.0)
Protein, ur: NEGATIVE
SPECIFIC GRAVITY, URINE: 1.025 (ref 1.001–1.035)
YEAST: NONE SEEN [HPF]

## 2015-03-23 LAB — WET PREP FOR TRICH, YEAST, CLUE
CLUE CELLS WET PREP: NONE SEEN
Trich, Wet Prep: NONE SEEN
Yeast Wet Prep HPF POC: NONE SEEN

## 2015-03-23 MED ORDER — CIPROFLOXACIN HCL 250 MG PO TABS: 250.0000 mg | ORAL_TABLET | Freq: Two times a day (BID) | ORAL | Status: DC

## 2015-03-23 NOTE — Patient Instructions (Signed)

## 2015-03-23 NOTE — Progress Notes (Signed)
Patient ID: [name], female   DOB: 1965/06/27, 49 y.o.   MRN: [id number] Presents with complaint of increased urinary frequency with odor, denies pain or burning with urination. TVH, minimal discharge. Reports UTI last year with similar type symptoms. Denies abdominal pain or fever.  Exam: Appears well. Abdomen soft, obese nontender. External genitalia within normal limits, speculum exam mild erythema, wet prep negative. UA: 3+ blood, trace leukocytes, 6-10 WBCs, 40-60 rbc's, few bacteria.  Probable UTI with hematuria  Plan: Cipro 250 by mouth twice a day for 3 days #6 prescription, proper use given and reviewed. Check UA at annual exam next month. Urine culture pending. Instructed to call if no relief of symptoms.

## 2015-03-24 ENCOUNTER — Telehealth: Payer: Self-pay

## 2015-03-24 DIAGNOSIS — R3129 Other microscopic hematuria: Secondary | ICD-10-CM

## 2015-03-24 LAB — URINE CULTURE
Colony Count: NO GROWTH
Organism ID, Bacteria: NO GROWTH

## 2015-03-24 NOTE — Telephone Encounter (Signed)
Cheryl Booth, once I added the reaction to Levaquin to her chart an allergy/contraindication warning popped up for Levaquin (Levofloxacin).  I called patient back and told her not to take it until she hears from me again as I needed to check with you.  She questioned does she really even need to take it since "only a little blood in my urine".

## 2015-03-24 NOTE — Addendum Note (Signed)
Addended by: Keenan BachelorANNAS, KATHERINE R on: 03/24/2015 03:01 PM   Modules accepted: Orders

## 2015-03-24 NOTE — Telephone Encounter (Signed)
She had large amount of blood in her urine, culture has not returned, should be back tomorrow. We can repeat clean-catch UA, if hematuria persists we'll refer to urology.

## 2015-03-24 NOTE — Addendum Note (Signed)
Addended by: Keenan BachelorANNAS, KATHERINE R on: 03/24/2015 03:22 PM   Modules accepted: Orders

## 2015-03-24 NOTE — Telephone Encounter (Signed)
Patient informed not to take Cipro and we will await her urine culture tomorrow.  Rec cc u/a to make sure blood clears regardless. Order placed.

## 2015-03-24 NOTE — Telephone Encounter (Signed)
Patient called with concern because she was prescribed Cipro yesterday. After picking it up she thought this was the medication that she had bad side affects with back in January w Dr. Glenetta HewJF.  I looked in her chart and that was actually Levaquin. I informed her they are different meds and she should be okay with the Cipro.    I called her pharmacy and reported the Levaquin as allergy and added it to her allergy list here.

## 2015-03-24 NOTE — Telephone Encounter (Signed)
thanks

## 2015-03-25 ENCOUNTER — Telehealth: Payer: Self-pay | Admitting: *Deleted

## 2015-03-25 NOTE — Telephone Encounter (Signed)
Notes faxed they will fax me back with time and date to relay to patient. 

## 2015-03-25 NOTE — Telephone Encounter (Signed)
-----   Message from Keenan BachelorKatherine R Booth, [place]MA sent at 03/25/2015 11:56 AM EST ----- Regarding: referral to urology Per [place] "Please call and review urine culture is negative, she does have a large amount of blood in her urine, possible kidney stone? best to increase fluids. Is she having any pain? Review with Cheryl HesselbachMaria blood in urine needs to have follow-up, 9 months ago no blood in urine"  Patient is not having any pain and is concerned. She wants to go ahead now with referral to urologist and not wait to come back in one mos. I let [place] know you would be scheduling her. Patient said first available will be great.  Thanks!!

## 2015-03-26 IMAGING — CR DG FOOT COMPLETE 3+V*R*
3 series · 3 of 3 positions shown · non-contrast
Comparison: None.

CLINICAL DATA: Foot pain

EXAM:
RIGHT FOOT COMPLETE - 3+ VIEW

[AP]
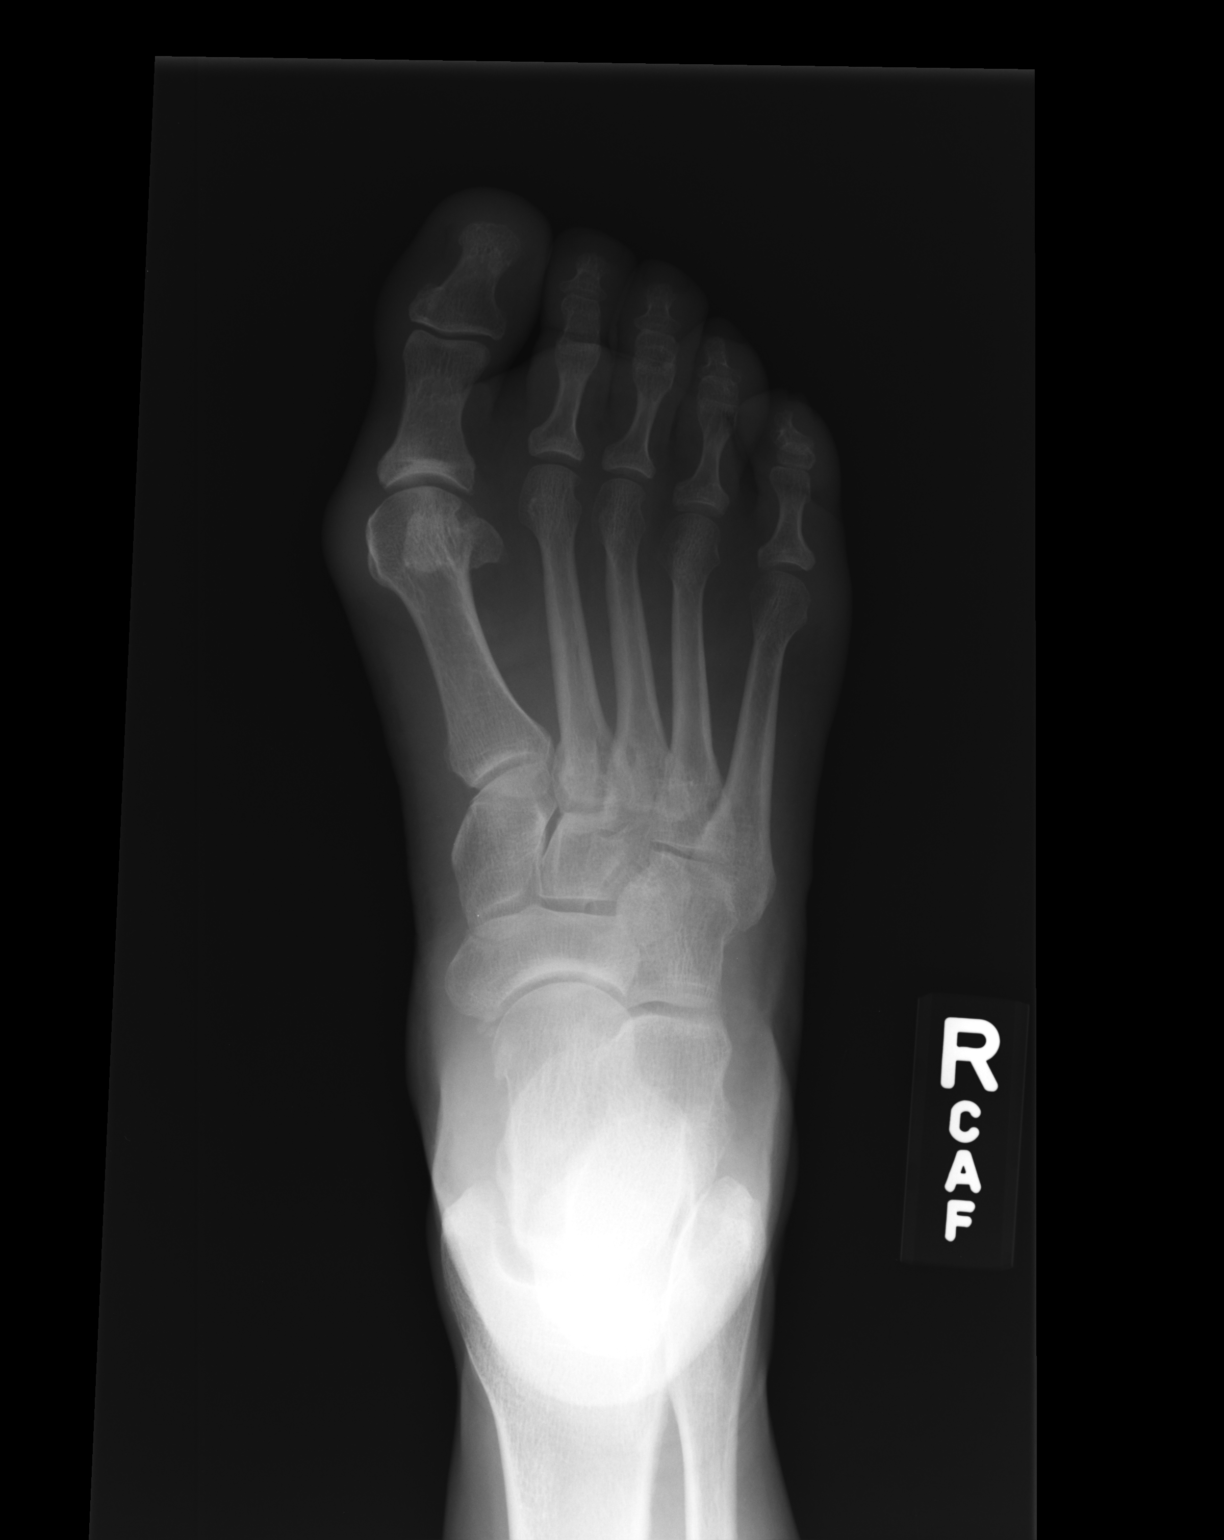

[ap obl int rot]
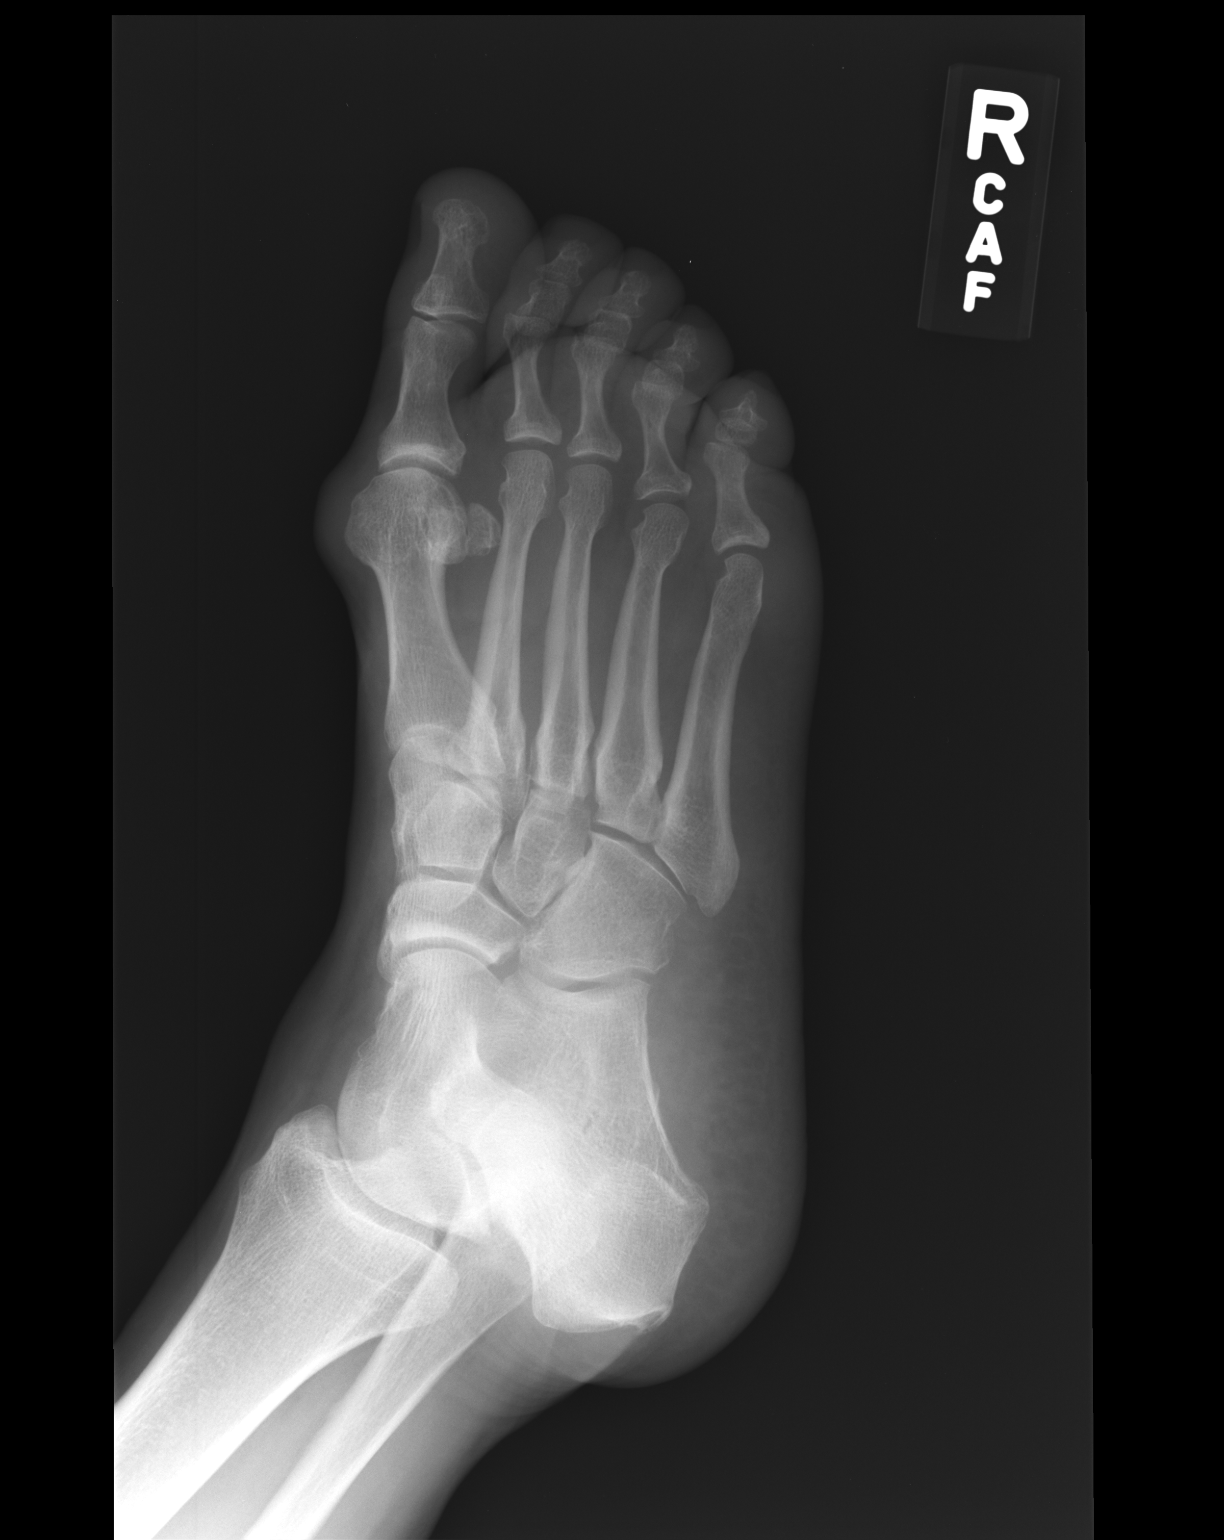

[lateral]
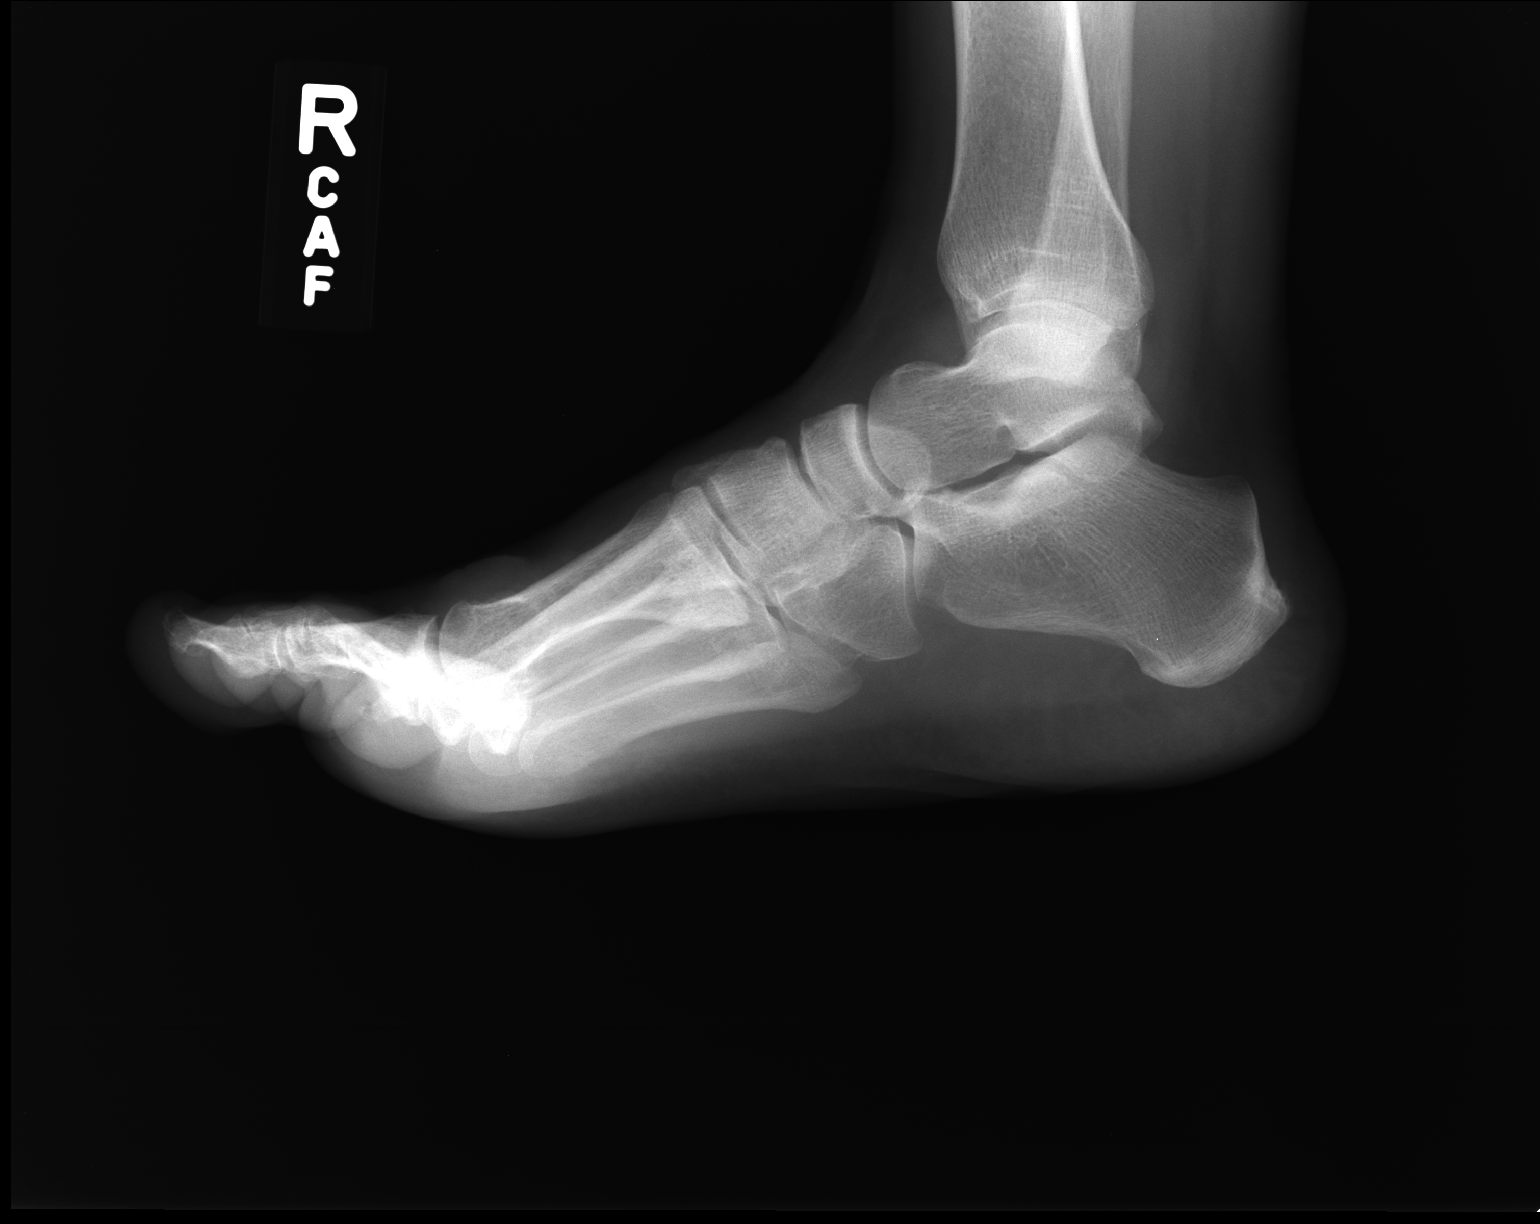

[3 of 3 positions shown; findings below may reference images not displayed]

FINDINGS: No fracture or dislocation is seen.

Mild degenerative changes the 1st MTP joint with hallux valgus
deformity and overlying soft tissue bunion.

The visualized soft tissues are unremarkable.
IMPRESSION: Mild degenerative changes with hallux valgus deformity and overlying
soft tissue bunion.

## 2015-03-29 NOTE — Telephone Encounter (Signed)
Appointment on 05/20/15 @ 3:15 pm Dr.Herrick pt aware.

## 2015-04-25 ENCOUNTER — Encounter: Payer: Self-pay | Admitting: Gynecology

## 2015-04-25 ENCOUNTER — Telehealth: Payer: Self-pay | Admitting: *Deleted

## 2015-04-25 ENCOUNTER — Ambulatory Visit (INDEPENDENT_AMBULATORY_CARE_PROVIDER_SITE_OTHER): Payer: BLUE CROSS/BLUE SHIELD | Admitting: Gynecology

## 2015-04-25 VITALS — BP 124/82

## 2015-04-25 DIAGNOSIS — R350 Frequency of micturition: Secondary | ICD-10-CM | POA: Diagnosis not present

## 2015-04-25 DIAGNOSIS — N390 Urinary tract infection, site not specified: Secondary | ICD-10-CM | POA: Diagnosis not present

## 2015-04-25 DIAGNOSIS — R300 Dysuria: Secondary | ICD-10-CM | POA: Diagnosis not present

## 2015-04-25 DIAGNOSIS — M5440 Lumbago with sciatica, unspecified side: Secondary | ICD-10-CM

## 2015-04-25 LAB — URINALYSIS W MICROSCOPIC + REFLEX CULTURE
Bilirubin Urine: NEGATIVE
CASTS: NONE SEEN [LPF]
CRYSTALS: NONE SEEN [HPF]
Glucose, UA: NEGATIVE
Ketones, ur: NEGATIVE
Nitrite: POSITIVE — AB
RBC / HPF: >60 RBC/HPF — AB (ref <=2)
Specific Gravity, Urine: 1.02 (ref 1.001–1.035)
YEAST: NONE SEEN [HPF]
pH: 7 (ref 5.0–8.0)

## 2015-04-25 MED ORDER — SULFAMETHOXAZOLE-TRIMETHOPRIM 800-160 MG PO TABS: 1.0000 | ORAL_TABLET | Freq: Two times a day (BID) | ORAL | Status: DC

## 2015-04-25 NOTE — Telephone Encounter (Signed)
Pt was seen today and was told Rx was going to be sent to pharmacy for UTI Rx is not there. Please advise

## 2015-04-25 NOTE — Patient Instructions (Signed)
Sulfamethoxazole; Trimethoprim, SMX-TMP tablets Qu es este medicamento? El compuesto SULFAMETOXAZOL; TRIMETOPRIMA o SMX-TMP es una combinacin de un antibitico sulfonamida y un segundo antibitico, trimetoprima. Se utiliza para tratar o prevenir ciertos tipos de infecciones bacterianas. No es efectivo para resfros, gripe u otras infecciones de origen viral. Este medicamento puede ser utilizado para otros usos; si tiene alguna pregunta consulte con su proveedor de atencin mdica o con su farmacutico. Qu le debo informar a mi profesional de la salud antes de tomar este medicamento? Necesita saber si usted presenta alguno de los [employer] o situaciones: -anemia -asma -recibe tratamiento con anticonvulsivos -si consume bebidas alcohlicas con frecuencia -enfermedad renal -enfermedad heptica -bajo nivel de cido flico o de glucosa-6-fosfato deshidrogenasa -malnutricin o malabsorcin -porfiria -alergias severas -trastorno tiroideo -una reaccin alrgica o inusual al sulfametoxazol, a la trimetoprima, sulfonamidas, a otros medicamentos, alimentos, colorantes o conservantes -si est embarazada o buscando quedar embarazada -si est amamantando a un beb Cmo debo utilizar este medicamento? Tome este medicamento por va oral con un vaso lleno de agua. Siga las instrucciones de la etiqueta del [place]. Tome sus dosis a intervalos regulares. No tome su medicamento con una frecuencia mayor que la indicada. No omita ninguna dosis o suspenda el uso de su medicamento antes de lo indicado. Hable con su pediatra para informarse acerca del uso de este medicamento en nios. Puede requerir atencin especial. Aunque este medicamento puede ser recetado a nios tan menores como de 2 meses de [place]. Sobredosis: Pngase en contacto inmediatamente con un centro toxicolgico o una sala de urgencia si usted cree que haya tomado demasiado medicamento. ATENCIN: [employer] es solo para  usted. No comparta este medicamento con nadie. Qu sucede si me olvido de una dosis? Si olvida una dosis, tmela lo antes posible. Si es casi la hora de la prxima dosis, tome slo esa dosis. No tome dosis adicionales o dobles. Qu puede interactuar con este medicamento? No tome esta medicina con ninguno de los siguientes medicamentos: -aminobenzoato de potasio -dofetilida -metronidazol Esta medicina tambin puede interactuar con los siguientes medicamentos: -inhibidores de la ECA, benazepril, enalapril, lisinopril y ramipril -pldoras anticonceptivas -ciclosporina -digoxina -diurticos -indometacina -medicamentos para la diabetes -metenamina -metotrexato -fenitona -suplementos de potasio -pirimetamina -sulfinpirazona -antidepresivos tricclicos -warfarina Puede ser que esta lista no menciona todas las posibles interacciones. Informe a su profesional de [employer] de [employer] productos a base de hierbas, medicamentos de [place] o suplementos nutritivos que est tomando. Si usted fuma, consume bebidas alcohlicas o si utiliza drogas ilegales, indqueselo tambin a su profesional de [employer]. Algunas sustancias pueden interactuar con su medicamento. A qu debo estar atento al usar [employer]? Consulte a su mdico o a su profesional de la salud si sus sntomas no mejoran. Beba varios vasos de Warehouse manager. Esto le ayudar a Software engineer de Pharmacist, hospital. No trate la diarrea con productos de [employer]. Comunquese con su mdico si tiene diarrea que dura ms de 2 das o si es severa y [place]. Este medicamento puede aumentar la sensibilidad al sol. Mantngase fuera de Secretary/administrator. Si no lo puede evitar, utilice ropa protectora y crema de Orthoptist. No utilice lmparas solares, camas solares ni cabinas solares. Qu efectos secundarios puedo tener al [employer] este medicamento? Efectos secundarios que debe informar a su mdico o a Producer, television/film/video de la  salud tan pronto como sea posible: -Therapist, art como erupcin cutnea o urticarias, hinchazn de la cara, labios o lengua -problemas respiratorios -  fiebre, escalofros, dolor de garganta -pulso cardiaco irregular o dolor en el pecho -dolores articulares o musculares -dolor o dificultad para orinar -puntos rojos en la piel -enrojecimiento, formacin de ampollas, descamacin o distensin de la piel, inclusive dentro de la boca -sangrado o magulladuras inusuales -cansancio o debilidad inusual -color amarillento de los ojos o la piel Efectos secundarios que, por lo general, no requieren Psychologist, prison and probation services (debe informarlos a su mdico o a Producer, television/film/video de la salud si persisten o si son molestos): -diarrea -mareos -dolor de Training and development officer -prdida del apetito -nuseas, vmito -nerviosismo Puede ser que esta lista no menciona todos los posibles efectos secundarios. Comunquese a su mdico por asesoramiento mdico [employer]. Usted puede informar los efectos secundarios a la FDA por telfono al 1-800-FDA-1088. Dnde debo guardar mi medicina? Mantngala fuera del alcance de los nios. Gurdela a [employer], entre 20 y 25 grados C (37 y 43 grados F). Protjala de la luz. Deseche todo el medicamento que no haya utilizado, despus de la fecha de vencimiento. ATENCIN: Este folleto es un resumen. Puede ser que no cubra toda la posible informacin. Si usted tiene preguntas acerca de esta medicina, consulte con su mdico, su farmacutico o su profesional de Radiographer, therapeutic.    2016, Elsevier/Gold Standard. (2014-06-22 00:00:00) Infeccin urinaria  (Urinary Tract Infection)  La infeccin urinaria puede ocurrir en cualquier lugar del tracto urinario. El tracto urinario es un sistema de drenaje del cuerpo por el que se eliminan los desechos y el exceso de [place]. El tracto urinario est formado por dos riones, dos urteres, la vejiga y Engineer, mining. Los riones son rganos que tienen  forma de frijol. Cada rin tiene aproximadamente el tamao del puo. Estn situados debajo de las [place], uno a cada lado de la columna vertebral CAUSAS  La causa de la infeccin son los microbios, que son organismos microscpicos, que incluyen hongos, virus, y bacterias. Estos organismos son tan pequeos que slo pueden verse a travs del microscopio. Las bacterias son los microorganismos que ms comnmente causan infecciones urinarias.  SNTOMAS  Los sntomas pueden variar segn la edad y el sexo del paciente y por la ubicacin de la infeccin. Los sntomas en las mujeres jvenes incluyen la necesidad frecuente e intensa de orinar y una sensacin dolorosa de ardor en la vejiga o en la uretra durante la miccin. Las mujeres y los hombres mayores podrn sentir cansancio, temblores y debilidad y Futures trader musculares y Engineer, mining abdominal. Si tiene [place], puede significar que la infeccin est en los riones. Otros sntomas son dolor en la espalda o en los lados debajo de las [place], nuseas y vmitos.  DIAGNSTICO  Para diagnosticar una infeccin urinaria, el mdico le preguntar acerca de sus sntomas. [employer] una [place] de [place]. La muestra de orina se analiza para Engineer, manufacturing bacterias y glbulos blancos de Risk manager. Los glbulos blancos se forman en el organismo para ayudar a Artist las infecciones.  TRATAMIENTO  Por lo general, las infecciones urinarias pueden tratarse con medicamentos. Debido a que la [employer] de las infecciones son causadas por bacterias, por lo general pueden tratarse con antibiticos. La eleccin del antibitico y la duracin del tratamiento depender de sus sntomas y el tipo de bacteria causante de la infeccin.  INSTRUCCIONES PARA EL CUIDADO EN EL HOGAR   Si le recetaron antibiticos, tmelos exactamente como su mdico le indique. Termine el medicamento aunque se sienta mejor despus de haber tomado slo algunos.  Beba gran cantidad de lquido para  mantener la orina de tono claro o color amarillo plido.  Evite la cafena, el t y las [address] Hospital Placebebidas gaseosas. Estas sustancias irritan la vejiga.  Vaciar la vejiga con frecuencia. Evite retener la orina durante largos perodos.  Vace la vejiga antes y despus de Management consultanttener relaciones sexuales.  Despus de mover el intestino, las mujeres deben higienizarse la regin perineal desde adelante hacia atrs. Use slo un papel tissue por vez. SOLICITE ATENCIN MDICA SI:   Siente dolor en la espalda.  Le sube la fiebre.  Los sntomas no mejoran luego de [address] das. SOLICITE ATENCIN MDICA DE INMEDIATO SI:   Siente dolor intenso en la espalda o en la zona inferior del abdomen.  Comienza a sentir escalofros.  Tiene nuseas o vmitos.  Tiene una sensacin continua de quemazn o molestias al ConocoPhillipsorinar. ASEGRESE DE QUE:   Comprende estas instrucciones.  Controlar su enfermedad.  Solicitar ayuda de inmediato si no mejora o empeora.   Esta informacin no tiene Theme park managercomo fin reemplazar el consejo del mdico. Asegrese de hacerle al mdico cualquier pregunta que tenga.   Document Released: 02/07/2005 Document Revised: 01/23/2012 Elsevier Interactive Patient Education [employer]2016 Elsevier Inc.

## 2015-04-25 NOTE — Progress Notes (Signed)
   Patient is a 49 year old been complaining of worsening dysuria and frequency and low back discomfort. Because of her 3+ hematuria a negative urine culture on November 9 our nurse practitioner refer patient to the urologist. Patient had an extensive evaluation to include a CT scan along with cystoscopy which was reported to be normal. Patient presented back to the urologist because of the mentioned symptoms and they believed that it was attributed to the cystoscopy to urethral spasm and gave her an anti-spasmodic agent. Patient denies any fever, chills, nausea, vomiting  Exam: Back some mild discomfort but no true CVA tenderness Abdomen: Some suprapubic tenderness noted Pelvic exam: Not done  Urinalysis: WBC packed, RBC greater than 60, many bacteria  Assessment/plan: Clinical evidence of urinary tract infection. Patient was started on Septra DS 1 by mouth twice a day for 7 days. She can continue the anti-spasmodic agent that was given to her by the urologist she can take one tablet twice a day for the next 3 days. She was encouraged to increase her fluid intake. If she develops severe back pain or fever, chills, nausea vomiting she is to report to the office.

## 2015-04-25 NOTE — Telephone Encounter (Signed)
Sorry I am slammed dunked. Plewase call in Septra DS one PO BID for 7 days

## 2015-04-25 NOTE — Telephone Encounter (Signed)
Patient aware Rx sent.  

## 2015-04-28 LAB — URINE CULTURE: Colony Count: >=100000

## 2015-04-29 ENCOUNTER — Encounter: Payer: Self-pay | Admitting: Obstetrics and Gynecology

## 2015-05-04 ENCOUNTER — Encounter: Payer: Self-pay | Admitting: Gynecology

## 2015-05-04 ENCOUNTER — Ambulatory Visit (INDEPENDENT_AMBULATORY_CARE_PROVIDER_SITE_OTHER): Payer: BLUE CROSS/BLUE SHIELD | Admitting: Gynecology

## 2015-05-04 VITALS — BP 130/74 | Ht 60.0 in | Wt 161.0 lb

## 2015-05-04 DIAGNOSIS — Z01419 Encounter for gynecological examination (general) (routine) without abnormal findings: Secondary | ICD-10-CM | POA: Diagnosis not present

## 2015-05-04 DIAGNOSIS — Z23 Encounter for immunization: Secondary | ICD-10-CM

## 2015-05-04 LAB — URINALYSIS W MICROSCOPIC + REFLEX CULTURE
Bilirubin Urine: NEGATIVE
Casts: NONE SEEN [LPF]
Crystals: NONE SEEN [HPF]
Glucose, UA: NEGATIVE
KETONES UR: NEGATIVE
Nitrite: NEGATIVE
PH: 6.5 (ref 5.0–8.0)
Protein, ur: NEGATIVE
Specific Gravity, Urine: 1.02 (ref 1.001–1.035)
Yeast: NONE SEEN [HPF]

## 2015-05-04 NOTE — Progress Notes (Signed)
[name] 04-23-1966 [id number]   History:    49 y.o.  for annual gyn exam who recently was treated for urinary tract infection with Macrobid twice a day for 7 days which she just completed 2 days ago. The microbiology sensitivity and organism or Escherichia coli sensitive an antibiotic the patient was placed on. Patient is asymptomatic slight tingling sensation when urinating. No back pain, no fever, no chills, no vomiting.  Patient with past history of transvaginal hysterectomy with anterior and posterior colporrhaphy has done well. In 2011 patient had resection of colon polyp which was benign. No past history of any abnormal Pap smears before her hysterectomy or after. Patient requesting flu vaccine today. Patient with no vasomotor symptoms and currently on no hormone  Past medical history,surgical history, family history and social history were all reviewed and documented in the EPIC chart.  Gynecologic History Patient's last menstrual period was 04/22/2005. Contraception: status post hysterectomy Last Pap: 2012. Results were: normal Last mammogram: 2016. Results were: Results pending  Obstetric History OB History  Gravida Para Term Preterm AB SAB TAB Ectopic Multiple Living  2 1 1  1 1    1     # Outcome Date GA Lbr Len/2nd Weight Sex Delivery Anes PTL Lv  2 SAB           1 Term     M Vag-Spont  N Y       ROS: A ROS was performed and pertinent positives and negatives are included in the history.  GENERAL: No fevers or chills. HEENT: No change in vision, no earache, sore throat or sinus congestion. NECK: No pain or stiffness. CARDIOVASCULAR: No chest pain or pressure. No palpitations. PULMONARY: No shortness of breath, cough or wheeze. GASTROINTESTINAL: No abdominal pain, nausea, vomiting or diarrhea, melena or bright red blood per rectum. GENITOURINARY: No urinary frequency, urgency, hesitancy or dysuria. MUSCULOSKELETAL: No joint or muscle pain, no back pain, no recent  trauma. DERMATOLOGIC: No rash, no itching, no lesions. ENDOCRINE: No polyuria, polydipsia, no heat or cold intolerance. No recent change in weight. HEMATOLOGICAL: No anemia or easy bruising or bleeding. NEUROLOGIC: No headache, seizures, numbness, tingling or weakness. PSYCHIATRIC: No depression, no loss of interest in normal activity or change in sleep pattern.     Exam: chaperone present  BP 130/74 mmHg  Ht 5' (1.524 m)  Wt 161 lb (73.029 kg)  BMI 31.44 kg/m2  LMP 04/22/2005  Body mass index is 31.44 kg/(m^2).  General appearance : Well developed well nourished female. No acute distress HEENT: Eyes: no retinal hemorrhage or exudates,  Neck supple, trachea midline, no carotid bruits, no thyroidmegaly Lungs: Clear to auscultation, no rhonchi or wheezes, or rib retractions  Heart: Regular rate and rhythm, no murmurs or gallops Breast:Examined in sitting and supine position were symmetrical in appearance, no palpable masses or tenderness,  no skin retraction, no nipple inversion, no nipple discharge, no skin discoloration, no axillary or supraclavicular lymphadenopathy Abdomen: no palpable masses or tenderness, no rebound or guarding Extremities: no edema or skin discoloration or tenderness  Pelvic:  Bartholin, Urethra, Skene Glands: Within normal limits             Vagina: No gross lesions or discharge  Cervix: Absent  Uterus  Absent  Adnexa  Without masses or tenderness  Anus and perineum  normal   Rectovaginal  normal sphincter tone without palpated masses or tenderness             Hemoccult  cards provided     Assessment/Plan:  49 y.o. female for annual exam recently treated for urinary tract infection. Today's urinalysis demonstrated the following:  White blood cell: 6-10 RBC 3-10 Bacteria: Few  Will await for the result of the urine culture sensitivity. Patient to return later in the week for fasting blood work consisting of the following: Fasting lipid profile,  comprehensive metabolic panel, TSH, CBC. Pap smear not indicated. Patient was reminded the importance of monthly breast exam. She was reminded to contact her gastroenterologist for her next colonoscopy since his been over 3 years now she does have history of colon polyps. I have provided her with fecal Hemoccult cards for her to submit to the office for testing. She did receive the flu vaccine today.   Ok Edwards MD, 10:31 AM 05/04/2015

## 2015-05-04 NOTE — Patient Instructions (Signed)
La menopausia (Menopause) La menopausia es el perodo normal de la vida en el cual los perodos menstruales cesan por completo. Se completa cuando no se tienen 12 perodos menstruales consecutivos. Ocurre generalmente en mujeres entre los 40 y los 55 aos. Muy rara vez una mujer tiene la menopausia antes de los 40 aos. En la menopausia, los ovarios dejan de producir las hormonas femeninas estrgeno y progesterona. Esto puede causar sntomas indeseables y tambin afectar a la salud. A veces los sntomas aparecen entre 4 y 5 aos antes de que comience la menopausia. No existe una relacin entre la menopausia y:  Los anticonceptivos orales.  La cantidad de hijos que tuvo.  La raza.  La edad en que comenzaron los perodos menstruales (menarca). Las mujeres que fuman mucho y las que son muy delgadas pueden desarrollar la menopausia a una edad ms temprana. CAUSAS  Los ovarios dejan de producir las hormonas femeninas estrgeno y progesterona.  Otras causas son:  Ciruga en la que se extirpan ambos ovarios.  Los ovarios que dejan de funcionar sin un motivo conocido.  Tumores de la glndula pituitaria en el cerebro.  Enfermedades que afectan los ovarios y la produccin de hormonas.  Radioterapia en el abdomen o la pelvis.  Quimioterapia que afecte a los ovarios. SNTOMAS   Acaloramiento.  Sudoracin nocturna.  Disminucin del deseo sexual.  Sequedad vaginal y adelgazamiento de la vagina, lo que causa relaciones sexuales dolorosas.  Sequedad de la piel y aparicin de arrugas.  Dolores de cabeza.  Cansancio.  Irritabilidad.  Problemas de memoria.  Aumento de peso.  Infeccin de la vejiga.  Aparicin de vello en el rostro y el pecho.  Infertilidad. Los sntomas ms graves pueden incluir:  Prdida de masa sea osteoporosis) que favorece la rotura de huesos(fracturas).  Depresin.  Endurecimiento y estrechamiento de las arterias (aterosclerosis) lo que puede causar  infarto e ictus. DIAGNSTICO   El perodo menstrual no aparece por 12 meses seguidos.  Examen fsico.  Estudios hormonales de la sangre. TRATAMIENTO  Hay muchas opciones de tratamiento y casi tantas dudas como opciones existen. La decisin de tratar o no los cambios que trae la menopausia es una decisin que realiza el profesional de acuerdo con cada persona en particular. El profesional comentar las opciones de tratamiento con usted. Juntos podrn decidir qu tratamiento es el mejor para usted. Las opciones de tratamiento pueden incluir:   Terapia hormonal (estrgenos y progesterona).  Medicamentos no hormonales.  Tratamiento de los sntomas individuales con medicamentos (por ejemplo antidepresivos para la depresin).  Algunos medicamentos herbales pueden ayudar en sntomas especficos.  Psicoterapia con un psiclogo o psiquiatra.  Terapia grupal.  Cambios en el estilo de vida, como:  Consumir una dieta saludable.  Actividad fsica regular.  Limitar el consumo de cafena y alcohol.  Control del estrs y meditacin.  No realizar tratamiento. INSTRUCCIONES PARA EL CUIDADO EN EL HOGAR   Tome todos los medicamentos como el mdico le indique.  Descanse y duerma lo suficiente.  Haga ejercicios regularmente.  Consuma una dieta que contenga calcio (bueno para los huesos) y productos derivados de la soja (actan como estrgenos).  Evite las bebidas alcohlicas.  No fume.  Si tiene sofocones, vstase en capas.  Tome suplementos de calcio y vitamina D para fortalecer los huesos.  Puede utilizar lubricantes y humectantes de venta libre para la sequedad vaginal.  En algunos casos la terapia de grupo podr ayudarla.  La acupuntura puede ser de ayuda en ciertos casos. SOLICITE ATENCIN MDICA   SI:   No est segura de estar en la menopausia.  Tiene sntomas de menopausia y necesita asesoramiento y tratamiento.  Tiene 55 aos y an tiene perodos menstruales.  Tiene  dolor durante las relaciones sexuales.  La menopausia se ha completado (no ha tenido el perodo menstrual durante 12 meses) y tiene un sangrado vaginal.  Necesita ser derivada a un especialista (gineclogo, psiquiatra, o psiclogo) para realizar un tratamiento. SOLICITE ATENCIN MDICA DE INMEDIATO SI:   Siente una depresin intensa e incontrolable.  Tiene una hemorragia vaginal abundante.  Siente y cree que se ha fracturado un hueso.  Siente dolor al orinar.  Siente dolor en el pecho o en la pierna.  Tiene latidos cardacos rpidos (palpitaciones).  Siente dolor de cabeza intenso.  Tiene problemas de visin.  Siente un bulto en la mama.  Siente un dolor abdominal intenso o indigestin.   Esta informacin no tiene como fin reemplazar el consejo del mdico. Asegrese de hacerle al mdico cualquier pregunta que tenga.   Document Released: 06/11/2006 Document Revised: 12/31/2012 Elsevier Interactive Patient Education 2016 Elsevier Inc.  

## 2015-05-05 ENCOUNTER — Other Ambulatory Visit: Payer: BLUE CROSS/BLUE SHIELD

## 2015-05-05 DIAGNOSIS — R3129 Other microscopic hematuria: Secondary | ICD-10-CM

## 2015-05-05 DIAGNOSIS — Z01419 Encounter for gynecological examination (general) (routine) without abnormal findings: Secondary | ICD-10-CM

## 2015-05-05 LAB — LIPID PANEL
CHOLESTEROL: 194 mg/dL (ref 125–200)
HDL: 48 mg/dL (ref >=46)
LDL Cholesterol: 114 mg/dL (ref <130)
TRIGLYCERIDES: 159 mg/dL — AB (ref <150)
Total CHOL/HDL Ratio: 4 Ratio (ref <=5.0)
VLDL: 32 mg/dL — AB (ref <30)

## 2015-05-05 LAB — CBC WITH DIFFERENTIAL/PLATELET
Basophils Absolute: 0 10*3/uL (ref 0.0–0.1)
Basophils Relative: 0 % (ref 0–1)
EOS ABS: 0.1 10*3/uL (ref 0.0–0.7)
Eosinophils Relative: 2 % (ref 0–5)
HCT: 38.7 % (ref 36.0–46.0)
Hemoglobin: 12.9 g/dL (ref 12.0–15.0)
Lymphocytes Relative: 31 % (ref 12–46)
Lymphs Abs: 2.2 10*3/uL (ref 0.7–4.0)
MCH: 28.9 pg (ref 26.0–34.0)
MCHC: 33.3 g/dL (ref 30.0–36.0)
MCV: 86.6 fL (ref 78.0–100.0)
MONOS PCT: 7 % (ref 3–12)
MPV: 9.2 fL (ref 8.6–12.4)
Monocytes Absolute: 0.5 10*3/uL (ref 0.1–1.0)
NEUTROS PCT: 60 % (ref 43–77)
Neutro Abs: 4.3 10*3/uL (ref 1.7–7.7)
PLATELETS: 313 10*3/uL (ref 150–400)
RBC: 4.47 MIL/uL (ref 3.87–5.11)
RDW: 12.7 % (ref 11.5–15.5)
WBC: 7.2 10*3/uL (ref 4.0–10.5)

## 2015-05-05 LAB — COMPREHENSIVE METABOLIC PANEL
ALBUMIN: 4.3 g/dL (ref 3.6–5.1)
ALT: 37 U/L — ABNORMAL HIGH (ref 6–29)
AST: 23 U/L (ref 10–35)
Alkaline Phosphatase: 74 U/L (ref 33–130)
BUN: 15 mg/dL (ref 7–25)
CHLORIDE: 104 mmol/L (ref 98–110)
CO2: 25 mmol/L (ref 20–31)
Calcium: 9.4 mg/dL (ref 8.6–10.4)
Creat: 0.72 mg/dL (ref 0.50–1.05)
Glucose, Bld: 90 mg/dL (ref 65–99)
Potassium: 4.4 mmol/L (ref 3.5–5.3)
Sodium: 140 mmol/L (ref 135–146)
Total Bilirubin: 0.5 mg/dL (ref 0.2–1.2)
Total Protein: 7.3 g/dL (ref 6.1–8.1)

## 2015-05-05 LAB — TSH: TSH: 1.078 u[IU]/mL (ref 0.350–4.500)

## 2015-05-05 LAB — URINE CULTURE
Colony Count: NO GROWTH
Organism ID, Bacteria: NO GROWTH

## 2015-05-06 ENCOUNTER — Other Ambulatory Visit: Payer: BLUE CROSS/BLUE SHIELD

## 2015-05-10 ENCOUNTER — Other Ambulatory Visit: Payer: BLUE CROSS/BLUE SHIELD | Admitting: Anesthesiology

## 2015-05-10 ENCOUNTER — Other Ambulatory Visit: Payer: BLUE CROSS/BLUE SHIELD

## 2015-05-10 DIAGNOSIS — Z1211 Encounter for screening for malignant neoplasm of colon: Secondary | ICD-10-CM

## 2015-05-13 ENCOUNTER — Encounter: Payer: Self-pay | Admitting: Gynecology

## 2015-05-26 ENCOUNTER — Ambulatory Visit (INDEPENDENT_AMBULATORY_CARE_PROVIDER_SITE_OTHER): Payer: BLUE CROSS/BLUE SHIELD | Admitting: Gynecology

## 2015-05-26 ENCOUNTER — Encounter: Payer: Self-pay | Admitting: Gynecology

## 2015-05-26 VITALS — BP 130/80 | Ht 60.0 in | Wt 161.0 lb

## 2015-05-26 DIAGNOSIS — R300 Dysuria: Secondary | ICD-10-CM | POA: Diagnosis not present

## 2015-05-26 DIAGNOSIS — N3001 Acute cystitis with hematuria: Secondary | ICD-10-CM | POA: Diagnosis not present

## 2015-05-26 DIAGNOSIS — B373 Candidiasis of vulva and vagina: Secondary | ICD-10-CM | POA: Diagnosis not present

## 2015-05-26 DIAGNOSIS — B3731 Acute candidiasis of vulva and vagina: Secondary | ICD-10-CM

## 2015-05-26 LAB — URINALYSIS W MICROSCOPIC + REFLEX CULTURE
BILIRUBIN URINE: NEGATIVE
CASTS: NONE SEEN [LPF]
Crystals: NONE SEEN [HPF]
Glucose, UA: NEGATIVE
Ketones, ur: NEGATIVE
NITRITE: NEGATIVE
Protein, ur: NEGATIVE
SPECIFIC GRAVITY, URINE: 1.02 (ref 1.001–1.035)
pH: 7 (ref 5.0–8.0)

## 2015-05-26 MED ORDER — CEFUROXIME AXETIL 250 MG PO TABS: 250.0000 mg | ORAL_TABLET | Freq: Two times a day (BID) | ORAL | Status: DC

## 2015-05-26 MED ORDER — FLUCONAZOLE 150 MG PO TABS
ORAL_TABLET | ORAL | Status: DC
Start: 2015-05-26 — End: 2015-08-08

## 2015-05-26 NOTE — Progress Notes (Signed)
   Patient is a 50 year old who presented to the office complaining of suprapubic pressure frequency and some burning during urination. She denied any fever, chills, nausea, vomiting mild back discomfort. Review of her record indicated that in December 2015 she was treated for urinary tract infection and microorganism identified with pseudomonas aeruginosa and In December 2016 it was Escherichia coli. We looked at the sensitivity panel to put on appropriate antibiotics and she appears to have a recurrent urinary tract infection but she has multitude of allergies as can be seen in the allergy section of her record. Patient denied any vaginal discharge.  Exam: Abdomen: Slight suprapubic discomfort Pelvic: Bartholin urethra Skene was within normal limits Vagina: No lesions or discharge Cervix: No lesions or discharge Uterus: Anteverted normal size shape and consistency. Slight suprapubic tenderness Adnexa: No palpable masses or tenderness Rectal exam not done  Urinalysis: 6-10 WBC, 10-20 RBC, many bacteria and few yeast  Assessment/plan: #1 urinary tract infection will be treated with Ceftin 250 mg twice a day for 7 days For yeast infection she'll be prescribed Diflucan 150 mg one by mouth.

## 2015-05-26 NOTE — Patient Instructions (Signed)
diflucanFluconazole tablets Qu es este medicamento? El FLUCONAZOL es un medicamento antimictico. Se utiliza para tratar ciertos tipos de infecciones micticas o por levadura. Este medicamento puede ser utilizado para otros usos; si tiene alguna pregunta consulte con su proveedor de atencin mdica o con su farmacutico. Qu le debo informar a mi profesional de la salud antes de tomar este medicamento? Necesita saber si usted presenta alguno de los siguientes problemas o situaciones: -antecedentes de pulso cardaco irregular -enfermedad renal -una reaccin alrgica o inusual al fluconazol, a otros azoles u otros medicamentos, alimentos, colorantes o conservantes -si est embarazada o buscando quedar embarazada -si est amamantando a un beb Cmo debo utilizar este medicamento? Tome este medicamento por va oral. Siga las instrucciones de la etiqueta del [place]. No tome su medicamento con una frecuencia mayor a la indicada. Hable con su pediatra para informarse acerca del uso de este medicamento en nios. Puede requerir atencin especial. Este medicamento ha sido recetado a nios tan menores como de 6 meses de [place]. Sobredosis: Pngase en contacto inmediatamente con un centro toxicolgico o una sala de urgencia si usted cree que haya tomado demasiado medicamento. ATENCIN: [employer] es solo para usted. No comparta este medicamento con nadie. Qu sucede si me olvido de una dosis? Si olvida una dosis, tmela lo antes posible. Si es casi la hora de la prxima dosis, tome slo esa dosis. No tome dosis adicionales o dobles. Qu puede interactuar con este medicamento? No tome esta medicina con ninguno de los siguientes medicamentos: -astemizol -ciertos medicamentos para el pulso cardiaco irregular, tales como dofetilida, dronedarona, quinidina -cisapride -eritromicina -lomitapida -otros medicamentos que prolongan el intervalo QT (causa un ritmo cardiaco  anormal) -pimozida -terfenadina -tioridazina -tolvaptn -ziprasidona Esta medicina tambin puede interactuar con los siguientes medicamentos: -medicamentos antivirales para el VIH o SIDA -pldoras anticonceptivas -ciertos antibiticos, tales como rifabutina, rifampicina -ciertos medicamentos para la presin sangunea, tales como amlodipina, isradipina, felodipino, hidroclorotiazida, losartn, nifedipina -ciertos medicamentos para el cncer, tales como ciclofosfamida, vinblastina, vincristina -ciertos medicamentos para el colesterol, tales como atorvastatina, lovastatina, fluvastatina, simvastatina -ciertos medicamentos para la depresin, ansiedad o trastornos psicticos, tales como amitriptilina, midazolam, nortriptilina, triazolam -ciertos medicamentos para la diabetes, tales como glipizida, gliburida, tolbutamida -ciertos medicamentos para Chief Technology Officer, tales como alfentanilo, fentanilo, metadona -ciertos medicamentos para convulsiones, tales como carbamazepina, fenitona -ciertos medicamentos que tratan o previenen cogulos sanguneos, tales como warfarina -halofantrina -medicamentos que reducen su capacidad de combatir infecciones, tales como ciclosporina, prednisona, tacrolimo -los AINE, medicamentos para el dolor o inflamacin, tales como celecoxib, diclofenaco, flurbiprofeno, ibuprofeno, meloxicam, naproxeno -otros medicamentos para infecciones micticas -sirolims -teofilina -tofacitinib Puede ser que esta lista no menciona todas las posibles interacciones. Informe a su profesional de [employer] de [employer] Inc productos a base de hierbas, medicamentos de [place] o suplementos nutritivos que est tomando. Si usted fuma, consume bebidas alcohlicas o si utiliza drogas ilegales, indqueselo tambin a su profesional de [employer]. Algunas sustancias pueden interactuar con su medicamento. A qu debo estar atento al usar [employer]? Visite a su mdico o a su profesional de la salud para  chequear su evolucin peridicamente. Si toma este medicamento durante un perodo de [employer], podr Manufacturing systems engineer anlisis de [place]. Si los sntomas no mejoran, consulte a su mdico. Pueden transcurrir [employer] o meses de tratamiento antes de que algunas infecciones micticas se curen. El alcohol puede aumentar la posibilidad de daos al hgado. Evite consumir bebidas alcohlicas. Si tiene una infeccin vaginal, no tenga Product manager que [place]  completado el tratamiento. Puede usar una toallita higinica. No use tampones. Use ropa interior de algodn no sintticos, y recin lavadas. Qu efectos secundarios puedo tener al Boston Scientificutilizar este medicamento? Efectos secundarios que debe informar a su mdico o a Producer, television/film/videosu profesional de la salud tan pronto como sea posible: -Therapist, artreacciones alrgicas como erupcin cutnea, picazn o urticarias, hinchazn de los labios, boca, lengua o garganta -orina de color amarillo oscuro -sensacin de mareos o desmayos -pulso cardiaco irregular o dolor en el pecho -enrojecimiento, formacin de ampollas, descamacin o distensin de la piel, inclusive dentro de la boca -dificultad al respirar -sangrado o magulladuras inusuales -vmito -color amarillento de los ojos o la piel Efectos secundarios que, por lo general, no requieren Psychologist, prison and probation servicesatencin mdica (debe informarlos a su mdico o a Producer, television/film/videosu profesional de la salud si persisten o si son molestos): -cambios en el sabor de los alimentos -diarrea -dolor de cabeza -Programme researcher, broadcasting/film/videomalestar estomacal o nuseas Puede ser que esta lista no menciona todos los posibles efectos secundarios. Comunquese a su mdico por asesoramiento mdico Hewlett-Packardsobre los efectos secundarios. Usted puede informar los efectos secundarios a la FDA por telfono al 1-800-FDA-1088. Dnde debo guardar mi medicina? Mantngala fuera del alcance de los nios. Gurdela a Sanmina-SCItemperatura ambiente, a menos de 30 grados C (86 grados F). Deseche todo el medicamento que no  haya utilizado, despus de la fecha de vencimiento. ATENCIN: Este folleto es un resumen. Puede ser que no cubra toda la posible informacin. Si usted tiene preguntas acerca de esta medicina, consulte con su mdico, su farmacutico o su profesional de Radiographer, therapeuticla salud.    2016, Elsevier/Gold Standard. (2014-06-23 00:00:00) Cefuroxime tablets Qu es este medicamento? La CEFUROXIMA es un antibitico cefalospornico. Se utiliza en el tratamiento de ciertos tipos de infecciones bacterianas. No es efectivo para resfros, gripe u otras infecciones de origen viral. Este medicamento puede ser utilizado para otros usos; si tiene alguna pregunta consulte con su proveedor de atencin mdica o con su farmacutico. Qu le debo informar a mi profesional de la salud antes de tomar este medicamento? Necesita saber si usted presenta alguno de los siguientes problemas o situaciones: -problemas de sangrado -enfermedad intestinal, como colitis -enfermedad renal -enfermedad hepatica -una reaccin alrgica o inusual a la cefuroxima, otros antibiticos o medicamentos, alimentos, colorantes o conservantes -si est embarazada o buscando quedar embarazada -si est amamantando a un beb Cmo debo utilizar este medicamento? Tome este medicamento por va oral con un vaso lleno de agua. Siga las instrucciones de la etiqueta del [place]idanmedicamento. No lo triture ni mastique. Este medicamento acta mejor si lo toma con alimentos. Tome sus dosis a intervalos regulares. No tome su medicamento con una frecuencia mayor a la indicada. Complete todo el tratamiento con el medicamento como indicado aun si se siente mejor. No omita ninguna dosis o suspenda el uso de su medicamento antes de lo indicado. Hable con su pediatra para informarse acerca del uso de este medicamento en nios. Puede requerir atencin especial. Aunque este medicamento ha sido recetado a nios tan menores como de 3 meses de edad para condiciones selectivas, las precauciones se  aplican. Sobredosis: Pngase en contacto inmediatamente con un centro toxicolgico o una sala de urgencia si usted cree que haya tomado demasiado medicamento. ATENCIN: Reynolds AmericanEste medicamento es solo para usted. No comparta este medicamento con nadie. Qu sucede si me olvido de una dosis? Si olvida una dosis, tmela lo antes posible. Si es casi la hora de la prxima dosis, tome slo esa dosis. No tome dosis adicionales o dobles. Ladell HeadsQu  puede interactuar con este medicamento? Esta medicina puede interactuar con los siguientes medicamentos: -anticidos -pldoras anticonceptivas -ciertos medicamentos para tratar una infeccin, tales como la [place], gentamicina, tobramicina -diurticos -probenecid -warfarina Puede ser que esta lista no menciona todas las posibles interacciones. Informe a su profesional de [employer] de [employer] Inc productos a base de hierbas, medicamentos de [place] o suplementos nutritivos que est tomando. Si usted fuma, consume bebidas alcohlicas o si utiliza drogas ilegales, indqueselo tambin a su profesional de [employer]. Algunas sustancias pueden interactuar con su medicamento. A qu debo estar atento al usar [employer] Corporation? Si los sntomas no mejoran o si desarrolla sntomas nuevos, informe a su mdico o a su profesional de [employer]. No trate la diarrea con productos de [employer]. Comunquese con su mdico si tiene diarrea que dura ms de 2 das o si es severa y [place]. Este medicamento puede interferir con algunos anlisis de glucosa en la orina. Consulte a su profesional de la salud si [employer]. Si est recibiendo tratamiento para [place] enfermedad de transmisin sexual, evite todo contacto sexual hasta que haya finalizado el [place]. Su pareja tambin podr Network engineer. Qu efectos secundarios puedo tener al [employer] este medicamento? Efectos secundarios que debe informar a su mdico o a Producer, television/film/video de la salud tan pronto como sea  posible: -Therapist, art como erupcin cutnea, picazn o urticarias, hinchazn de la cara, labios o lengua -orina de color oscuro -dificultad al respirar -fiebre -pulso cardiaco irregular o dolor en el pecho -enrojecimiento, formacin de ampollas, descamacin o distensin de la piel, inclusive dentro de la boca -convulsiones -sangrado o magulladuras inusuales -cansancio o debilidad inusual -manchas blancas o llagas dentro de la boca Efectos secundarios que, por lo general, no requieren atencin mdica (debe informarlos a su mdico o a su profesional de la salud si persisten o si son molestos): -diarrea -gas o acidez estomacal -dolor de cabeza -nuseas, vmito -picazn vaginal Puede ser que esta lista no menciona todos los posibles efectos secundarios. Comunquese a su mdico por asesoramiento mdico [employer]lett[employer]ard. Usted puede informar los efectos secundarios a la FDA por telfono al 1-800-FDA-1088. Dnde debo guardar mi medicina? Mantngala fuera del alcance de los nios. Gurdela a Sanmina-SCI, entre 15 y 30 grados C (14 y 80 grados F). Mantenga el envase bien cerrado. Protjalo de la humedad. Deseche todo el medicamento que no haya utilizado, despus de la fecha de vencimiento. ATENCIN: Este folleto es un resumen. Puede ser que no cubra toda la posible informacin. Si usted tiene preguntas acerca de esta medicina, consulte con su mdico, su farmacutico o su profesional de Radiographer, therapeutic.    2016, Elsevier/Gold Standard. (2014-06-22 00:00:00) Infeccin urinaria  (Urinary Tract Infection)  La infeccin urinaria puede ocurrir en cualquier lugar del tracto urinario. El tracto urinario es un sistema de drenaje del cuerpo por el que se eliminan los desechos y el exceso de [place]. El tracto urinario est formado por dos riones, dos urteres, la vejiga y Engineer, mining. Los riones son rganos que tienen forma de frijol. Cada rin tiene aproximadamente el tamao del puo.  Estn situados debajo de las [place], uno a cada lado de la columna vertebral CAUSAS  La causa de la infeccin son los microbios, que son organismos microscpicos, que incluyen hongos, virus, y bacterias. Estos organismos son tan pequeos que slo pueden verse a travs del microscopio. Las bacterias son los microorganismos que ms comnmente causan infecciones urinarias.  SNTOMAS  Los sntomas pueden variar  segn la edad y el sexo del paciente y por la ubicacin de la infeccin. Los sntomas en las mujeres jvenes incluyen la necesidad frecuente e intensa de orinar y una sensacin dolorosa de ardor en la vejiga o en la uretra durante la miccin. Las mujeres y los hombres mayores podrn sentir cansancio, temblores y debilidad y Futures trader musculares y Engineer, mining abdominal. Si tiene [place], puede significar que la infeccin est en los riones. Otros sntomas son dolor en la espalda o en los lados debajo de las [place], nuseas y vmitos.  DIAGNSTICO  Para diagnosticar una infeccin urinaria, el mdico le preguntar acerca de sus sntomas. [employer] una [place] de [place]. La muestra de orina se analiza para Engineer, manufacturing bacterias y glbulos blancos de Risk manager. Los glbulos blancos se forman en el organismo para ayudar a Artist las infecciones.  TRATAMIENTO  Por lo general, las infecciones urinarias pueden tratarse con medicamentos. Debido a que la [employer]son de las infecciones son causadas por bacterias, por lo general pueden tratarse con antibiticos. La eleccin del antibitico y la duracin del tratamiento depender de sus sntomas y el tipo de bacteria causante de la infeccin.  INSTRUCCIONES PARA EL CUIDADO EN EL HOGAR   Si le recetaron antibiticos, tmelos exactamente como su mdico le indique. Termine el medicamento aunque se sienta mejor despus de haber tomado slo algunos.  Beba gran cantidad de lquido para mantener la orina de tono claro o color amarillo plido.  Evite la  cafena, el t y las [address]. Estas sustancias irritan la vejiga.  Vaciar la vejiga con frecuencia. Evite retener la orina durante largos perodos.  Vace la vejiga antes y despus de Management consultant.  Despus de mover el intestino, las mujeres deben higienizarse la regin perineal desde adelante hacia atrs. Use slo un papel tissue por vez. SOLICITE ATENCIN MDICA SI:   Siente dolor en la espalda.  Le sube la fiebre.  Los sntomas no mejoran luego de [address]. SOLICITE ATENCIN MDICA DE INMEDIATO SI:   Siente dolor intenso en la espalda o en la zona inferior del abdomen.  Comienza a sentir escalofros.  Tiene nuseas o vmitos.  Tiene una sensacin continua de quemazn o molestias al [employer]. ASEGRESE DE QUE:   Comprende estas instrucciones.  Controlar su enfermedad.  Solicitar ayuda de inmediato si no mejora o empeora.   Esta informacin no tiene Theme park manager el consejo del mdico. Asegrese de hacerle al mdico cualquier pregunta que tenga.   Document Released: 02/07/2005 Document Revised: 01/23/2012 Elsevier Interactive Patient Education [employer]

## 2015-05-27 LAB — URINE CULTURE

## 2015-06-03 ENCOUNTER — Encounter: Payer: Self-pay | Admitting: Gynecology

## 2015-08-08 ENCOUNTER — Ambulatory Visit (INDEPENDENT_AMBULATORY_CARE_PROVIDER_SITE_OTHER): Payer: BLUE CROSS/BLUE SHIELD | Admitting: Emergency Medicine

## 2015-08-08 VITALS — BP 118/70 | HR 89 | Temp 98.1°F | Resp 18 | Ht 60.5 in | Wt 163.8 lb

## 2015-08-08 DIAGNOSIS — R6889 Other general symptoms and signs: Secondary | ICD-10-CM | POA: Diagnosis not present

## 2015-08-08 LAB — POCT INFLUENZA A/B
INFLUENZA A, POC: NEGATIVE
INFLUENZA B, POC: NEGATIVE

## 2015-08-08 MED ORDER — BENZONATATE 100 MG PO CAPS: 100.0000 mg | ORAL_CAPSULE | Freq: Three times a day (TID) | ORAL | Status: DC | PRN

## 2015-08-08 MED ORDER — OSELTAMIVIR PHOSPHATE 75 MG PO CAPS: 75.0000 mg | ORAL_CAPSULE | Freq: Two times a day (BID) | ORAL | Status: DC

## 2015-08-08 NOTE — Progress Notes (Signed)
Patient ID: ISYSS ESPINAL, female   DOB: 08-17-1965, 50 y.o.   MRN: [id number]     By signing my name below, I, Littie Deeds, attest that this documentation has been prepared under the direction and in the presence of Lesle Chris, MD.  Electronically Signed: Littie Deeds, Medical Scribe. 08/08/2015. 8:18 AM.   Chief Complaint:  Chief Complaint  Patient presents with  . Generalized Body Aches    x2-3 days ago  . Fever  . Nasal Congestion  . Cough    a little coughing; dry cough  . Chills    HPI: LITICIA GASIOR is a 50 y.o. female who reports to [facility] today complaining of gradual onset cough that started 3 days ago. The next day, patient began having chills, fever with T-max 103 F, headaches, generalized myalgias, and fatigue. Patient denies sore throat. She did receive the flu shot this year. Patient notes recently returned from a field trip to the [employer] with her son's class 3 days ago. She has allergies to mold and notes that they had been to a place that smelled like mold, but she is unsure if this is related to her symptoms. She does not think she had sick contacts with the flu on the trip, although there were about 200 children on the trip.  Past Medical History  Diagnosis Date  . NSVD (normal spontaneous vaginal delivery)   . Hiatal hernia    Past Surgical History  Procedure Laterality Date  . Dilation and curettage of uterus      x2  . Abdominal hysterectomy      TVH/ A&P REPAIR   Social History   Social History  . Marital Status: Married    Spouse Name: N/A  . Number of Children: N/A  . Years of Education: N/A   Social History Main Topics  . Smoking status: Never Smoker   . Smokeless tobacco: Never Used  . Alcohol Use: No  . Drug Use: No  . Sexual Activity: Yes    Birth Control/ Protection: Surgical     Comment: HYSTERECTOMY   Other Topics Concern  . None   Social History Narrative   Family History  Problem Relation Age of Onset  . Cancer Maternal Aunt  60    OVARIAN ( DESEASED)  . Cancer Maternal Aunt 60    STOMACH (DESEASED)  . Cancer Maternal Aunt     THYROID (DESEASED)  . Hypertension Mother    Allergies  Allergen Reactions  . Aspirin Anaphylaxis and Hives  . Nsaids Anaphylaxis and Hives  . Ciprofloxacin   . Codeine Nausea And Vomiting  . Hycodan [Hydrocodone-Homatropine] Nausea And Vomiting    Pt called back 07/31/11 and reported that she was vomiting from the hycodan  . Levaquin [Levofloxacin In D5w] Other (See Comments)    Joint pain, light headedness.   Prior to Admission medications   Medication Sig Start Date End Date Taking? Authorizing Provider  acetaminophen (TYLENOL) 325 MG tablet Take 650 mg by mouth every 6 (six) hours as needed for moderate pain or headache.    Yes Historical Provider, MD  cefUROXime (CEFTIN) 250 MG tablet Take 1 tablet (250 mg total) by mouth 2 (two) times daily with a meal. Patient not taking: Reported on 08/08/2015 05/26/15   Ok Edwards, MD  Digestive Enzymes (DIGESTIVE ENZYME PO) Take 1 each by mouth once. Reported on 08/08/2015    Historical Provider, MD     ROS: The patient denies unintentional weight loss,  chest pain, palpitations, wheezing, dyspnea on exertion, nausea, vomiting, abdominal pain, dysuria, hematuria, melena, numbness, weakness, or tingling.  All other systems have been reviewed and were otherwise negative with the exception of those mentioned in the HPI and as above.    PHYSICAL EXAM: Filed Vitals:   08/08/15 0813  BP: 118/70  Pulse: 89  Temp: 98.1 F (36.7 C)  Resp: 18   Body mass index is 31.45 kg/(m^2).   General: Alert, no acute distress HEENT:  Normocephalic, atraumatic, oropharynx patent. Nasal congestion. Eye: Non[facility] H[facility]OMI[facility] Cardiovascular:  Regular rate and rhythm, no rubs murmurs or gallops.  No Carotid bruits, radial pulse intact. No pedal edema.  Respiratory: Clear to auscultation bilaterally.  No wheezes, rales, or rhonchi.  No cyanosis, no use of  accessory musculature Abdominal: No organomegaly, abdomen is soft and non-tender, positive bowel sounds.  No masses. Musculoskeletal: Gait intact. No edema, tenderness Skin: No rashes. Neurologic: Facial musculature symmetric. Psychiatric: Patient acts appropriately throughout our interaction. Lymphatic: No cervical or submandibular lymphadenopathy    LABS: Results for orders placed or performed in visit on 08/08/15  POCT Influenza A/B  Result Value Ref Range   Influenza A, POC Negative Negative   Influenza B, POC Negative Negative     EKG/XRAY:   Primary [facility]   ASSESSMENT/PLAN: Flu test negative. I still suspect influenza. Will treat with Tamiflu and Tessalon. She was given a note until Thursday.I personally performed the services described in this documentation, which was scribed in my presence. The recorded information has been reviewed and is accurate.    Gross sideeffects, risk and benefits, and alternatives of medications d/w patient. Patient is aware that all medications have potential sideeffects and we are unable to predict every sideeffect or drug-drug interaction that may occur.  Lesle ChrisSteven Hazen Brumett MD 08/08/2015 8:18 AM

## 2015-08-08 NOTE — Patient Instructions (Addendum)
   IF you received an x-ray today, you will receive an invoice from [facility]. Please contact [facility] at [phone] with questions or concerns regarding your invoice.   IF you received labwork today, you will receive an invoice from [facility]. Please contact [facility] at [phone] with questions or concerns regarding your invoice.   Our billing staff will not be able to assist you with questions regarding bills from these companies.  You will be contacted with the lab results as soon as they are available. The fastest way to get your results is to activate your My Chart account. Instructions are located on the last page of this paperwork. If you have not heard from us regarding the results in 2 weeks, please contact this office.     Influenza, Adult Influenza ("the flu") is a viral infection of the respiratory tract. It occurs more often in winter months because people spend more time in close contact with one another. Influenza can make you feel very sick. Influenza easily spreads from person to person (contagious). CAUSES  Influenza is caused by a virus that infects the respiratory tract. You can catch the virus by breathing in droplets from an infected person's cough or sneeze. You can also catch the virus by touching something that was recently contaminated with the virus and then touching your mouth, nose, or eyes. RISKS AND COMPLICATIONS You may be at risk for a more severe case of influenza if you smoke cigarettes, have diabetes, have chronic heart disease (such as heart failure) or lung disease (such as asthma), or if you have a weakened immune system. Elderly people and pregnant women are also at risk for more serious infections. The most common problem of influenza is a lung infection (pneumonia). Sometimes, this problem can require emergency medical care and may be life threatening. SIGNS AND SYMPTOMS  Symptoms typically last 4  to 10 days and may include:  Fever.  Chills.  Headache, body aches, and muscle aches.  Sore throat.  Chest discomfort and cough.  Poor appetite.  Weakness or feeling tired.  Dizziness.  Nausea or vomiting. DIAGNOSIS  Diagnosis of influenza is often made based on your history and a physical exam. A nose or throat swab test can be done to confirm the diagnosis. TREATMENT  In mild cases, influenza goes away on its own. Treatment is directed at relieving symptoms. For more severe cases, your health care provider may prescribe antiviral medicines to shorten the sickness. Antibiotic medicines are not effective because the infection is caused by a virus, not by bacteria. HOME CARE INSTRUCTIONS  Take medicines only as directed by your health care provider.  Use a cool mist humidifier to make breathing easier.  Get plenty of rest until your temperature returns to normal. This usually takes 3 to 4 days.  Drink enough fluid to keep your urine clear or pale yellow.  Cover yourmouth and nosewhen coughing or sneezing,and wash your handswellto prevent thevirusfrom spreading.  Stay homefromwork orschool untilthe fever is gonefor at least 1full day. PREVENTION  An annual influenza vaccination (flu shot) is the best way to avoid getting influenza. An annual flu shot is now routinely recommended for all adults in the [place] SEEK MEDICAL CARE IF:  You experiencechest pain, yourcough worsens,or you producemore mucus.  Youhave nausea,vomiting, ordiarrhea.  Your fever returns or gets worse. SEEK IMMEDIATE MEDICAL CARE IF:  You havetrouble breathing, you become short of breath,or your skin ornails becomebluish.  You have severe painor   stiffnessin the neck.  You develop a sudden headache, or pain in the face or ear.  You have nausea or vomiting that you cannot control. MAKE SURE YOU:   Understand these instructions.  Will watch your condition.  Will get help  right away if you are not doing well or get worse.   This information is not intended to replace advice given to you by your health care provider. Make sure you discuss any questions you have with your health care provider.   Document Released: 04/27/2000 Document Revised: 05/21/2014 Document Reviewed: 07/30/2011 Elsevier Interactive Patient Education 2016 Elsevier Inc.  

## 2015-12-13 ENCOUNTER — Ambulatory Visit: Payer: BLUE CROSS/BLUE SHIELD

## 2016-04-28 ENCOUNTER — Encounter: Payer: Self-pay | Admitting: Gynecology

## 2016-04-28 DIAGNOSIS — Z1231 Encounter for screening mammogram for malignant neoplasm of breast: Secondary | ICD-10-CM | POA: Diagnosis not present

## 2016-05-04 ENCOUNTER — Ambulatory Visit (INDEPENDENT_AMBULATORY_CARE_PROVIDER_SITE_OTHER): Payer: BLUE CROSS/BLUE SHIELD | Admitting: Gynecology

## 2016-05-04 ENCOUNTER — Other Ambulatory Visit: Payer: Self-pay | Admitting: Gynecology

## 2016-05-04 ENCOUNTER — Encounter: Payer: Self-pay | Admitting: Gynecology

## 2016-05-04 VITALS — BP 128/80 | Ht 60.0 in | Wt 163.0 lb

## 2016-05-04 DIAGNOSIS — Z01419 Encounter for gynecological examination (general) (routine) without abnormal findings: Secondary | ICD-10-CM | POA: Diagnosis not present

## 2016-05-04 DIAGNOSIS — N8111 Cystocele, midline: Secondary | ICD-10-CM | POA: Insufficient documentation

## 2016-05-04 DIAGNOSIS — N8110 Cystocele, unspecified: Secondary | ICD-10-CM | POA: Insufficient documentation

## 2016-05-04 LAB — COMPREHENSIVE METABOLIC PANEL
ALK PHOS: 74 U/L (ref 33–130)
ALT: 37 U/L — AB (ref 6–29)
AST: 28 U/L (ref 10–35)
Albumin: 4 g/dL (ref 3.6–5.1)
BUN: 15 mg/dL (ref 7–25)
CO2: 24 mmol/L (ref 20–31)
CREATININE: 0.65 mg/dL (ref 0.50–1.05)
Calcium: 9.1 mg/dL (ref 8.6–10.4)
Chloride: 105 mmol/L (ref 98–110)
Glucose, Bld: 91 mg/dL (ref 65–99)
POTASSIUM: 4.4 mmol/L (ref 3.5–5.3)
SODIUM: 139 mmol/L (ref 135–146)
TOTAL PROTEIN: 7.5 g/dL (ref 6.1–8.1)
Total Bilirubin: 0.5 mg/dL (ref 0.2–1.2)

## 2016-05-04 LAB — CBC WITH DIFFERENTIAL/PLATELET
BASOS PCT: 0 %
Basophils Absolute: 0 cells/uL (ref 0–200)
EOS ABS: 252 {cells}/uL (ref 15–500)
Eosinophils Relative: 3 %
HCT: 39.5 % (ref 35.0–45.0)
HEMOGLOBIN: 13.2 g/dL (ref 11.7–15.5)
LYMPHS ABS: 2184 {cells}/uL (ref 850–3900)
Lymphocytes Relative: 26 %
MCH: 29 pg (ref 27.0–33.0)
MCHC: 33.4 g/dL (ref 32.0–36.0)
MCV: 86.8 fL (ref 80.0–100.0)
MONOS PCT: 6 %
MPV: 9.4 fL (ref 7.5–12.5)
Monocytes Absolute: 504 cells/uL (ref 200–950)
NEUTROS ABS: 5460 {cells}/uL (ref 1500–7800)
Neutrophils Relative %: 65 %
PLATELETS: 276 10*3/uL (ref 140–400)
RBC: 4.55 MIL/uL (ref 3.80–5.10)
RDW: 13.2 % (ref 11.0–15.0)
WBC: 8.4 10*3/uL (ref 3.8–10.8)

## 2016-05-04 LAB — URINALYSIS W MICROSCOPIC + REFLEX CULTURE
BILIRUBIN URINE: NEGATIVE
Casts: NONE SEEN [LPF]
Crystals: NONE SEEN [HPF]
Glucose, UA: NEGATIVE
KETONES UR: NEGATIVE
Nitrite: NEGATIVE
PH: 5.5 (ref 5.0–8.0)
Protein, ur: NEGATIVE
Specific Gravity, Urine: 1.015 (ref 1.001–1.035)
YEAST: NONE SEEN [HPF]

## 2016-05-04 LAB — LIPID PANEL
CHOLESTEROL: 190 mg/dL (ref <200)
HDL: 53 mg/dL (ref >50)
LDL Cholesterol: 116 mg/dL — ABNORMAL HIGH (ref <100)
Total CHOL/HDL Ratio: 3.6 Ratio (ref <5.0)
Triglycerides: 106 mg/dL (ref <150)
VLDL: 21 mg/dL (ref <30)

## 2016-05-04 LAB — TSH: TSH: 1 mIU/L

## 2016-05-04 MED ORDER — NITROFURANTOIN MONOHYD MACRO 100 MG PO CAPS: 100.0000 mg | ORAL_CAPSULE | Freq: Two times a day (BID) | ORAL | 0 refills | Status: DC

## 2016-05-04 NOTE — Progress Notes (Signed)
Cheryl PeeMaria T Booth 18-Feb-1966 [id number]   History:    50 y.o.  for annual gyn exam who was treated for urinary tract infection last year and believes she may have similar symptoms. Also she was referred to the urologist last year because she had been complaining that her urine stream was not straight and she also had a mild cystocele and feels that she has a bulging sensation in her vagina.  Patient with past history of transvaginal hysterectomy with anterior and posterior colporrhaphy has done well. In 2011 patient had resection of colon polyp which was benign. No past history of any abnormal Pap smears before her hysterectomy or after. Patient requesting flu vaccine today. Patient with no vasomotor symptoms and currently on no hormone.  Patient with mild URI now cannot received a flu vaccine.   Past medical history,surgical history, family history and social history were all reviewed and documented in the EPIC chart.  Gynecologic History Patient's last menstrual period was 04/22/2005. Contraception: status post hysterectomy Last Pap: 2012. Results were: normal Last mammogram: 2017. Results were: normal  Obstetric History OB History  Gravida Para Term Preterm AB Living  2 1 1   1 1   SAB TAB Ectopic Multiple Live Births  1       1    # Outcome Date GA Lbr Len/2nd Weight Sex Delivery Anes PTL Lv  2 SAB           1 Term     M Vag-Spont  N LIV       ROS: A ROS was performed and pertinent positives and negatives are included in the history.  GENERAL: No fevers or chills. HEENT: No change in vision, no earache, sore throat or sinus congestion. NECK: No pain or stiffness. CARDIOVASCULAR: No chest pain or pressure. No palpitations. PULMONARY: No shortness of breath, cough or wheeze. GASTROINTESTINAL: No abdominal pain, nausea, vomiting or diarrhea, melena or bright red blood per rectum. GENITOURINARY: No urinary frequency, urgency, hesitancy or dysuria. MUSCULOSKELETAL: No joint or muscle  pain, no back pain, no recent trauma. DERMATOLOGIC: No rash, no itching, no lesions. ENDOCRINE: No polyuria, polydipsia, no heat or cold intolerance. No recent change in weight. HEMATOLOGICAL: No anemia or easy bruising or bleeding. NEUROLOGIC: No headache, seizures, numbness, tingling or weakness. PSYCHIATRIC: No depression, no loss of interest in normal activity or change in sleep pattern.     Exam: chaperone present  BP 128/80   Ht 5' (1.524 m)   Wt 163 lb (73.9 kg)   LMP 04/22/2005   BMI 31.83 kg/m   Body mass index is 31.83 kg/m.  General appearance : Well developed well nourished female. No acute distress HEENT: Eyes: no retinal hemorrhage or exudates,  Neck supple, trachea midline, no carotid bruits, no thyroidmegaly Lungs: Clear to auscultation, no rhonchi or wheezes, or rib retractions  Heart: Regular rate and rhythm, no murmurs or gallops Breast:Examined in sitting and supine position were symmetrical in appearance, no palpable masses or tenderness,  no skin retraction, no nipple inversion, no nipple discharge, no skin discoloration, no axillary or supraclavicular lymphadenopathy Abdomen: no palpable masses or tenderness, no rebound or guarding Extremities: no edema or skin discoloration or tenderness  Pelvic:  Bartholin, Urethra, Skene Glands: Within normal limits             Vagina: No gross lesions or discharge  Cervix: Absent  Uterus absent  Adnexa  Without masses or tenderness  Anus and perineum  normal   Rectovaginal  normal sphincter tone without palpated masses or tenderness             Hemoccult cards will be provided   Urinalysis: 6-10 WBC, 10-20 RBC, few bacteria culture pending   Assessment/Plan:  50 y.o. female for annual exam with past history of urinary tract infection her microbiology culture and sensitivity was reviewed the organism identified in the past was Escherichia coli sensitive to Macrobid. Since the office is going to be closed in the next  several days on going to go ahead and prescribe Macrobid one by mouth twice a day for 7 days. For her vulvar URI she can take Mucinex or sinus congestion. I'm going to recommend she follow up with urologist once again to compare evaluation from last year. She does have a second-degree cystocele now but also her urine continues to not be straight and she is concerned and I'm going to make sure she sees him again. The following screening fasting blood work was ordered: Comprehensive metabolic panel, fasting lipid profile, TSH, CBC, and urinalysis. Patient will need a bone density study next year. We discussed importance of calcium vitamin D and weightbearing exercises for osteoporosis prevention.   Ok EdwardsFERNANDEZ,Rhythm Wigfall H MD, 9:09 AM 05/04/2016

## 2016-05-04 NOTE — Patient Instructions (Signed)
Reparacin del cistocele (Cystocele Repair) La reparacin del cistocele es un procedimiento quirrgico para extirpar un cistocele que es un bulto, una zona que cae (hernia) de la vejiga y que se extiende hacia la vagina. Este abultamiento o protrusin se produce en la pared anterior de la vagina. INFORME A SU MDICO:   Cualquier alergia que tenga.  Todos los Ches[place] Energymedicamentos que Ingramutiliza, incluyendo vitaminas, hierbas, gotas oftlmicas, cremas y [address]medicamentos de [address]a libre.  Uso de corticoides (por va oral o cremas).  Problemas previos que usted o los Graybar Electricmiembros de su familia hayan tenido con el uso de anestsicos.  Enfermedades de Clear Channel Communicationsla sangre.  Cirugas previas.  Padecimientos mdicos.  Posibilidad de embarazo, si correspondiera. RIESGOS Y COMPLICACIONES  Generalmente, ste es un procedimiento seguro. Sin embargo, Tree surgeoncomo en cualquier procedimiento, pueden surgir complicaciones. Las complicaciones posibles son:  Sharlyne PacasSangrado excesivo.  Infeccin.  Lesin en los rganos circundantes.  Problemas relacionados con la anestesia. Los riesgos podrn variar segn el tipo de anestesia administrada.  Problemas con el catter urinario despus de la ciruga, como una obstruccin.  Reaparicin del cistocele. ANTES DEL PROCEDIMIENTO   Consulte a su mdico si debe cambiar o suspender los medicamentos que toma habitualmente. Es posible que deba dejar de tomar ciertos medicamentos antes de la Azerbaijanciruga.  No coma ni beba nada despus de la medianoche anterior a la ciruga.  Si fuma, no lo haga al Foot Lockermenos en las dos semanas previas a la Azerbaijanciruga.  No beba alcohol los 3 [address] anteriores a la Azerbaijanciruga.  Pdale a alguna persona que la lleve a su casa despus de la hospitalizacin y que la ayude con las actividades durante la recuperacin. PROCEDIMIENTO   Le aplicarn un medicamento que la har dormir durante el procedimiento (anestesia general) o le inyectarn un medicamento para adormecer la zona de la cintura  para abajo (anestesia espinal) o anestesia epidural. Usted dormir o tendr el cuerpo adormecido durante todo el procedimiento.  Le colocarn un tubo delgado y flexible (catter Foley) en la vejiga para drenar la orina durante y despus de la Azerbaijanciruga.  Esta ciruga se realiza a travs de la vagina. La pared anterior de la vagina se abre, y el msculo que se encuentra entre la vejiga y la vagina se vuelve hacia su posicin normal. Esto se refuerza con puntos o un trozo de Ridgetopmalla. De este modo se extirpa la hernia y la parte superior de la vagina no caer en la abertura de la misma.  El corte en la pared anterior de la vagina se cierra con puntos que se reabsorben y no necesitan quitarse. DESPUS DEL PROCEDIMIENTO   La llevarn al rea de recuperacin donde controlarn su evolucin de cerca. Le controlarn con frecuencia la respiracin, la presin arterial y el pulso (signos vitales). Cuando se encuentre estable, ser llevada a una habitacin comn del hospital.  Tendr colocado un catter para drenar la vejiga. Este se Database administratordejar en el lugar durante 2 a 7 das o hasta que la vejiga funcione adecuadamente por s misma.  Tendr Neomia Dearuna gasa en la vagina. Esta se retirar en 1 o 2 das despus de la Azerbaijanciruga.  Le darn medicamentos para calmar el dolor segn sea necesario y podrn indicarle medicamentos para destruir los grmenes (antibiticos).  Ser necesario que Administrator, Civil Servicepermanezca en el hospital durante 1 - 2 das. Esta informacin no tiene Theme park managercomo fin reemplazar el consejo del mdico. Asegrese de hacerle al mdico cualquier pregunta que tenga. Document Released: 04/30/2005 Document Revised: 02/18/2013 Elsevier Interactive Patient Education  2017 Elsevier Inc.  

## 2016-05-06 LAB — URINE CULTURE: Organism ID, Bacteria: NO GROWTH

## 2016-05-10 ENCOUNTER — Other Ambulatory Visit: Payer: Self-pay | Admitting: Gynecology

## 2016-05-10 DIAGNOSIS — R3129 Other microscopic hematuria: Secondary | ICD-10-CM

## 2016-05-10 DIAGNOSIS — R7989 Other specified abnormal findings of blood chemistry: Secondary | ICD-10-CM

## 2016-05-10 DIAGNOSIS — R945 Abnormal results of liver function studies: Principal | ICD-10-CM

## 2016-05-11 LAB — HEPATITIS PANEL, ACUTE
HCV AB: NEGATIVE
HEP A IGM: NONREACTIVE
HEP B C IGM: NONREACTIVE
HEP B S AG: NEGATIVE

## 2016-05-16 ENCOUNTER — Ambulatory Visit (INDEPENDENT_AMBULATORY_CARE_PROVIDER_SITE_OTHER): Payer: BLUE CROSS/BLUE SHIELD | Admitting: Anesthesiology

## 2016-05-16 ENCOUNTER — Other Ambulatory Visit: Payer: BLUE CROSS/BLUE SHIELD

## 2016-05-16 DIAGNOSIS — R3129 Other microscopic hematuria: Secondary | ICD-10-CM | POA: Diagnosis not present

## 2016-05-16 DIAGNOSIS — R945 Abnormal results of liver function studies: Principal | ICD-10-CM

## 2016-05-16 DIAGNOSIS — Z23 Encounter for immunization: Secondary | ICD-10-CM

## 2016-05-16 DIAGNOSIS — R7989 Other specified abnormal findings of blood chemistry: Secondary | ICD-10-CM | POA: Diagnosis not present

## 2016-05-16 LAB — ALT: ALT: 33 U/L — ABNORMAL HIGH (ref 6–29)

## 2016-05-16 LAB — AST: AST: 29 U/L (ref 10–35)

## 2016-05-17 ENCOUNTER — Other Ambulatory Visit: Payer: Self-pay | Admitting: Anesthesiology

## 2016-05-17 ENCOUNTER — Telehealth: Payer: Self-pay | Admitting: *Deleted

## 2016-05-17 DIAGNOSIS — Z1211 Encounter for screening for malignant neoplasm of colon: Secondary | ICD-10-CM | POA: Diagnosis not present

## 2016-05-17 LAB — URINALYSIS W MICROSCOPIC + REFLEX CULTURE
BACTERIA UA: NONE SEEN [HPF]
BILIRUBIN URINE: NEGATIVE
Casts: NONE SEEN [LPF]
Crystals: NONE SEEN [HPF]
GLUCOSE, UA: NEGATIVE
KETONES UR: NEGATIVE
Nitrite: NEGATIVE
PROTEIN: NEGATIVE
RBC / HPF: NONE SEEN RBC/HPF (ref <=2)
SQUAMOUS EPITHELIAL / LPF: NONE SEEN [HPF] (ref <=5)
Specific Gravity, Urine: 1.01 (ref 1.001–1.035)
YEAST: NONE SEEN [HPF]
pH: 5.5 (ref 5.0–8.0)

## 2016-05-17 NOTE — Telephone Encounter (Signed)
Pt scheduled to [facility] per JF note on 05/04/16. Appointment on 05/22/16 @ 10:45am pt aware, recent office note faxed.

## 2016-05-18 LAB — URINE CULTURE

## 2016-05-22 DIAGNOSIS — R3915 Urgency of urination: Secondary | ICD-10-CM | POA: Diagnosis not present

## 2016-05-22 DIAGNOSIS — N8111 Cystocele, midline: Secondary | ICD-10-CM | POA: Diagnosis not present

## 2016-05-22 DIAGNOSIS — N281 Cyst of kidney, acquired: Secondary | ICD-10-CM | POA: Diagnosis not present

## 2016-05-23 ENCOUNTER — Other Ambulatory Visit: Payer: Self-pay | Admitting: Urology

## 2016-05-23 DIAGNOSIS — N281 Cyst of kidney, acquired: Secondary | ICD-10-CM

## 2016-06-07 ENCOUNTER — Ambulatory Visit (HOSPITAL_COMMUNITY)
Admission: RE | Admit: 2016-06-07 | Discharge: 2016-06-07 | Disposition: A | Discharge status: Home / Self Care | Payer: BLUE CROSS/BLUE SHIELD | Source: Ambulatory Visit | Attending: Urology | Admitting: Urology

## 2016-06-07 DIAGNOSIS — N281 Cyst of kidney, acquired: Secondary | ICD-10-CM | POA: Insufficient documentation

## 2016-06-07 DIAGNOSIS — K760 Fatty (change of) liver, not elsewhere classified: Secondary | ICD-10-CM | POA: Diagnosis not present

## 2016-06-07 MED ORDER — GADOBENATE DIMEGLUMINE 529 MG/ML IV SOLN
15.0000 mL | Freq: Once | INTRAVENOUS | Status: AC | PRN
  Administered 2016-06-07: 15 mL via INTRAVENOUS

## 2016-07-19 ENCOUNTER — Encounter: Payer: Self-pay | Admitting: Gynecology

## 2016-07-19 ENCOUNTER — Ambulatory Visit (INDEPENDENT_AMBULATORY_CARE_PROVIDER_SITE_OTHER): Payer: BLUE CROSS/BLUE SHIELD | Admitting: Gynecology

## 2016-07-19 VITALS — BP 118/74

## 2016-07-19 DIAGNOSIS — N6082 Other benign mammary dysplasias of left breast: Secondary | ICD-10-CM

## 2016-07-19 MED ORDER — DOXYCYCLINE HYCLATE 100 MG PO CAPS: 100.0000 mg | ORAL_CAPSULE | Freq: Two times a day (BID) | ORAL | 0 refills | Status: DC

## 2016-07-19 NOTE — Patient Instructions (Addendum)
Vacuna contra la culebrilla, Lo que debe saber (Shingles Vaccine: What You Need to Know) QU ES LA CULEBRILLA?  Es una erupcin dolorosa de la piel, en la que aparecen ampollas. Esta enfermedad tambin se denomina herpes zoster o zoster.  Se trata de una erupcin que aparece en un lado del rostro o del cuerpo y dura entre 2 y 4 semanas. El sntoma principal es el dolor, que puede ser bastante intenso. Otros sntomas son Grant Rutsfiebre, Solicitorcefalea, escalofros y Journalist, newspapermalestar en el estmago. En casos raros, esta infeccin puede causar neumona, problemas auditivos, ceguera, inflamacin cerebral (encefalitis) o la muerte.  En 1 de cada 5 personas, el dolor intenso puede persistir an despus que la erupcin desaparezca. Este dolor se denomina neurlagia post herptica.  La causa de la culebrilla es el virus Futures tradervaricela-zoster. Este es el mismo virus que causa la varicela. Slo las personas que han sufrido varicela o que han recibido la vacuna contra la varicela pueden tener culebrilla. El virus permanece en el organismo. Puede volver a aparecer muchos aos despus para causar culebrilla.  La culebrilla no se contagia. Sin embargo, una persona que nunca sufri varicela (ni recibi la vacuna) podra contraer varicela contagindose de alguien que padece culebrilla. Esto no es frecuente.  La culebrilla es ms frecuente BorgWarnerentre las personas mayores de 50 aos que entre los ms Mohawk Industriesjvenes. Tambin es ms frecuente en personas cuyo sistema inmunolgico est debilitado debido a Occupational hygienistuna enfermedad como el cncer o medicamentos como los corticoides o las drogas utilizadas en quimioterapia.  Al menos 1 milln de personas contrae culebrilla cada ao en los [place]. VACUNA CONTRA LA CULEBRILLA  Esta vacuna fue patentada en 2006. En los ensayos clnicos, se demostr que la vacuna prevena la culebrilla en alrededor de la mitad de Raytheonlas personas. Tambin reduce el dolor asociado a este trastorno.  Se indica una nica dosis de vacuna  en adultos de ms de 60 aos. ALGUNAS PERSONAS NO DEBEN RECIBIR LA VACUNA O DEBEN ESPERAR No deben vacunarse las personas que:  Alguna vez hayan sufrido una reaccin alrgica a la gelatina, al antibitico neomicina o a cualquier otro componente de la vacuna. Si observa una reaccin alrgica grave, comunquese con el mdico.  Su sistema inmunolgico est debilitado debido a:  SIDA u otra enfermedad que afecte el sistema inmunolgico.  Tratamientos con drogas que afecten el sistema inmunolgico como los corticoides.  Tratamientos para el cncer como la radiacin o la quimioterapia.  Tienen una historia de cncer que haya afectado la mdula sea o el sistema linftico, como leucemia o linfoma.  Estn embarazadas o desean estarlo. Las que deseen quedar embarazadas no deben SCANA Corporationhacerlo hasta al menos 4 semanas luego de recibir esta vacuna. Las personas con enfermedades menores como un resfro, pueden vacunarse. Las personas que sufren enfermedades moderadas o graves deben esperar hasta su recuperacin antes de recibir la vacuna. Aqu se incluye a quienes presenten una temperatura de 101.3 F (38.5 C). CULES SON LOS RIESGOS DE LA VACUNA CONTRA LA CULEBRILLA?  Cathleen CortiUna vacuna, como cualquier Apalachicolamedicamento, puede causar problemas serios, como por ejemplo reacciones alrgicas graves. Sin embargo, el riesgo de que cualquier vacuna cause un dao grave, o la muerte, es extremadamente pequeo.  No se han asociado reacciones de importancia a estas vacunas. Problemas leves  Enrojecimiento, dolor, hinchazn, o picazn en el lugar de la inyeccin (en 1 de cada 3 personas).  Cefalea (en aproximadamente 1 de cada 70 personas). Como todas las Elmorevacunas, esta vacuna sigue siendo controlada para Therapist, occupationalhallar problemas  infrecuentes o graves. QU DEBO HACER EN CASO DE UNA REACCIN MODERADA O GRAVE? Qu debo observar? Cualquier estado anormal como una reaccin alrgica grave o fiebre alta. Si ocurre una reaccin alrgica  grave, ocurrir Government social research officer unos pocos minutos hasta una hora despus de la aplicacin de la inyeccin. Signos de reaccin alrgica grave que presente dificultad para respirar, debilidad, ronquera o silbidos, ritmo cardaco acelerado, ronchas, mareos, palidez, o hinchazn de la garganta. Qu debo hacer?  Comunquese con el profesional que lo asiste o lleve inmediatamente a la persona al mdico.  Cuntele al mdico lo que ha sucedido, la fecha y momento en el que ocurri y cundo se aplic la vacuna.  Pdale a su mdico, enfermera o el departamento de salud que informen la reaccin llenando el formulario de Vaccine Adverse Event Report (Informe de reacciones adversas a las vacunas - VAERS). O, puede enviar este informe a travs de la pgina web del VAERS al http://www.vaers.LAgents.no o llamando al [phone]. El VAERS no proporciona orientacin mdica. CMO PUEDO SABER MS?  El profesional podr darle el prospecto de la vacuna o sugerirle otras fuentes de informacin.  Comunquese con los Marine scientist for [employer] and Prevention (Centros para el control y la prevencin de enfermedades, CDC).  Llame al [phone] (1-800-CDC-INFO).  Visite los sitios web de Energy [employer] Partners https://jennings.com/. CDC Shingles Vaccine-Spanish VIS (02/17/08) Esta informacin no tiene Theme park manager el consejo del mdico. Asegrese de hacerle al mdico cualquier pregunta que tenga. Document Released: 10/17/2007 Document Revised: 09/14/2014 Document Reviewed: 08/20/2012 Elsevier Interactive Patient Education  2017 Elsevier Inc. [place] epidrmico (Epidermal Cyst) Un quiste epidrmico a veces se denomina quiste de inclusin epidrmica o quiste infundibular. Es un saco de tejido cutneo. El saco contiene una sustancia llamada [place]. La queratina es una protena que normalmente se secreta a travs de folculos capilares. Cuando la queratina queda atrapada en la capa superior de la piel (epidermis),  puede formar un quiste epidrmico. Los quistes epidrmicos normalmente se encuentran en la cara, el cuello, el tronco o los genitales. Estos quistes por lo general no son dainos (son benignos), y es posible que no causen sntomas, salvo que se infecten. Es importante que no apriete los quistes epidrmicos. CAUSAS Esta afeccin puede ser causada por lo siguiente:  Un folculo capilar bloqueado.  Un pelo que se riza y vuelve a Cytogeneticist en la piel en vez de crecer hacia afuera de la piel (pelo encarnado).  Un poro bloqueado.  Irritacin de la piel.  Una lesin en la piel.  Ciertas afecciones que se transmiten de padres a hijos (hereditarias).  Virus del papiloma humano (VPH). FACTORES DE RIESGO Los siguientes factores pueden hacer que usted sea ms propenso a desarrollar un quiste epidrmico:  Acn.  Tener sobrepeso.  Usar ropa [place]. SNTOMAS El nico sntoma de esta afeccin puede ser un pequeo bulto no doloroso debajo de la piel. Cuando un quiste epidrmico se infecta, pueden aparecer algunos sntomas, como los siguientes:  Enrojecimiento.  Inflamacin.  Dolor a Insurance claims handler.  Calor.  Grant Ruts.  Queratina que sale del [place]. La queratina puede verse como una sustancia blanca griscea con mal olor.  Pus que sale del quiste. DIAGNSTICO Esta afeccin se diagnostica mediante un examen fsico. En algunos casos, pueden tomarle una muestra de tejido (biopsia) del quiste para examinarla con un microscopio o hacerle anlisis para ver si hay bacterias. Pueden derivarlo a un mdico especialista en el cuidado de la piel (dermatlogo). TRATAMIENTO En muchos casos, los quistes epidrmicos desaparecen por s  solos, sin tratamiento. Si un quiste se infecta, el tratamiento puede incluir lo siguiente:  Armandina Gemma y Electronics engineer. Despus del drenaje, se puede hacer una ciruga menor para [employer] restos del [employer].  Antibiticos como ayuda para prevenir una infeccin.  Inyecciones  de medicamentos (corticoides) que ayudan a Social research officer, government.  Ciruga para extirpar el quiste. Se puede realizar una ciruga si:  El quiste se [place].  El [employer].  Existe la posibilidad de que el quiste se [place] en un tipo de cncer. INSTRUCCIONES PARA EL CUIDADO EN EL HOGAR  Tome los medicamentos de venta libre y los recetados solamente como se lo haya indicado el mdico.  Si le recetaron un antibitico, tmelo como se lo haya indicado el mdico. No deje de usar el antibitico aunque comience a Actor.  Mantenga el rea que rodea el quiste limpia y [place].  Use ropa suelta y seca.  No intente apretar el quiste.  Evite tocar el quiste.  Revise el [employer]ys [employer] Us para detectar signos de infeccin.  Concurra a todas las visitas de control como se lo haya indicado el mdico. Esto es importante. PREVENCIN  Use ropa limpia y [place].  Evite usar ropa [place].  Mantenga la piel limpia y seca. Tome una ducha o un bao [employer].  Lvese el cuerpo con perxido de benzolo cuando tome una ducha o un bao. SOLICITE ATENCIN MDICA SI:  El quiste desarrolla sntomas de infeccin.  El problema no mejora, o empeora.  Le aparece un quiste con un aspecto diferente de otros quistes que ha tenido.  Tiene fiebre. SOLICITE ATENCIN MDICA DE INMEDIATO SI:  Aparece enrojecimiento en el quiste que se propaga hacia el rea que lo rodea. Esta informacin no tiene Theme park manager el consejo del mdico. Asegrese de hacerle al mdico cualquier pregunta que tenga. Document Released: 06/11/2006 Document Revised: 07/23/2011 Document Reviewed: 03/02/2015 Elsevier Interactive Patient Education  2017 Arvin[employer].

## 2016-07-19 NOTE — Progress Notes (Signed)
    Patient is a 51 year old that presented to the office today with concerns of 2 cystic areas that she noted on the upper outer quadrant of her left breast for the past few days and she has been applying Neosporin. Patient has been working on yard recently but denies any recent trauma or injury. She has not had the shingles vaccine. She denies any family history of breast cancer and her mammogram is up-to-date. (December 2017). Patient with prior history of vaginal hysterectomy with anterior and posterior colporrhaphy and is currently on no hormone replacement therapy.  Exam: Both breasts were examined sitting supine position there were symmetrical in appearance right breast no palpable masses or tenderness no supraclavicular axillary lymphadenopathy no nipple discharge. 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 She should use antibacterial soap and then she can apply the Neosporin twice a day. If it does not resolve within a week she will return back to the office.

## 2016-09-26 ENCOUNTER — Encounter: Payer: Self-pay | Admitting: Gynecology

## 2016-12-03 ENCOUNTER — Encounter: Payer: Self-pay | Admitting: Urgent Care

## 2016-12-03 ENCOUNTER — Ambulatory Visit (INDEPENDENT_AMBULATORY_CARE_PROVIDER_SITE_OTHER): Payer: BLUE CROSS/BLUE SHIELD | Admitting: Urgent Care

## 2016-12-03 VITALS — BP 122/66 | HR 70 | Temp 98.7°F | Resp 18 | Ht 60.0 in | Wt 165.0 lb

## 2016-12-03 DIAGNOSIS — J029 Acute pharyngitis, unspecified: Secondary | ICD-10-CM | POA: Diagnosis not present

## 2016-12-03 DIAGNOSIS — R059 Cough, unspecified: Secondary | ICD-10-CM

## 2016-12-03 DIAGNOSIS — R05 Cough: Secondary | ICD-10-CM | POA: Diagnosis not present

## 2016-12-03 MED ORDER — CETIRIZINE HCL 10 MG PO TABS: 10.0000 mg | ORAL_TABLET | Freq: Every day | ORAL | 11 refills | Status: DC

## 2016-12-03 MED ORDER — PSEUDOEPHEDRINE HCL ER 120 MG PO TB12: 120.0000 mg | ORAL_TABLET | Freq: Two times a day (BID) | ORAL | 3 refills | Status: DC

## 2016-12-03 NOTE — Patient Instructions (Addendum)
Para el dolor de garganta intente usar un t de miel. Use 3 cucharaditas de miel con jugo exprimido de CBS Corporationmedio limn. Coloque las piezas de [place] afeitadas en 1/2 - 1 taza de agua y caliente sobre la estufa. Luego mezcle los ingredientes y repita cada 4 horas.    Cough, Adult Coughing is a reflex that clears your throat and your airways. Coughing helps to heal and protect your lungs. It is normal to cough occasionally, but a cough that happens with other symptoms or lasts a long time may be a sign of a condition that needs treatment. A cough may last only 2-3 weeks (acute), or it may last longer than 8 weeks (chronic). What are the causes? Coughing is commonly caused by:  Breathing in substances that irritate your lungs.  A viral or bacterial respiratory infection.  Allergies.  Asthma.  Postnasal drip.  Smoking.  Acid backing up from the stomach into the esophagus (gastroesophageal reflux).  Certain medicines.  Chronic lung problems, including COPD (or rarely, lung cancer).  Other medical conditions such as heart failure.  Follow these instructions at home: Pay attention to any changes in your symptoms. Take these actions to help with your discomfort:  Take medicines only as told by your health care provider. ? If you were prescribed an antibiotic medicine, take it as told by your health care provider. Do not stop taking the antibiotic even if you start to feel better. ? Talk with your health care provider before you take a cough suppressant medicine.  Drink enough fluid to keep your urine clear or pale yellow.  If the air is dry, use a cold steam vaporizer or humidifier in your bedroom or your home to help loosen secretions.  Avoid anything that causes you to cough at work or at home.  If your cough is worse at night, try sleeping in a semi-upright position.  Avoid cigarette smoke. If you smoke, quit smoking. If you need help quitting, ask your health care  provider.  Avoid caffeine.  Avoid alcohol.  Rest as needed.  Contact a health care provider if:  You have new symptoms.  You cough up pus.  Your cough does not get better after 2-3 weeks, or your cough gets worse.  You cannot control your cough with suppressant medicines and you are losing sleep.  You develop pain that is getting worse or pain that is not controlled with pain medicines.  You have a fever.  You have unexplained weight loss.  You have night sweats. Get help right away if:  You cough up blood.  You have difficulty breathing.  Your heartbeat is very fast. This information is not intended to replace advice given to you by your health care provider. Make sure you discuss any questions you have with your health care provider. Document Released: 10/27/2010 Document Revised: 10/06/2015 Document Reviewed: 07/07/2014 Elsevier Interactive Patient Education  2017 Elsevier Inc.   Azithromycin tablets Qu es este medicamento? La AZITROMICINA es un antibitico macrlido. Se utiliza para tratar o prevenir ciertos tipos de infecciones bacterianas. No es efectivo para resfros, gripe u otras infecciones de origen viral. Este medicamento puede ser utilizado para otros usos; si tiene alguna pregunta consulte con su proveedor de atencin mdica o con su farmacutico. MARCAS COMUNES: Zithromax, Zithromax Tri-Pak, Zithromax Z-Pak Qu le debo informar a mi profesional de la salud antes de tomar este medicamento? Necesita saber si usted presenta alguno de los siguientes problemas o situaciones: -enfermedad renal -enfermedad heptica -enfermedad cardaca  o latido cardaco irregular -una reaccin alrgica o inusual a la azitromicina, a la eritromicina, otros antibiticos macrlidos, alimentos, colorantes o conservadores -si est embarazada o buscando quedar embarazada -si est amamantando a un beb Cmo debo utilizar este medicamento? Tome este medicamento por va oral con  un vaso lleno de agua. Siga las instrucciones de la etiqueta del Andrew. Las tabletas se puede tomar con comida o con el estmago vaco. Si el medicamento le produce AT[employer], tmelo con alimentos. Tome su medicamento a intervalos regulares. No tome su medicamento con una frecuencia mayor a la indicada. Complete todas las dosis de su medicamento como se le haya indicado, aun si se siente mejor. No omita ninguna dosis ni suspenda el uso de su medicamento antes de lo indicado. Hable con su pediatra para informarse acerca del uso de este medicamento en nios. Aunque este medicamento se puede recetar a nios tan pequeos como de 6 meses de edad con ciertas afecciones, existen precauciones que deben tomarse. Sobredosis: Pngase en contacto inmediatamente con un centro toxicolgico o una sala de urgencia si usted cree que haya tomado demasiado medicamento. ATENCIN: [employer] es solo para usted. No comparta este medicamento con nadie. Qu sucede si me olvido de una dosis? Si olvida una dosis, tmela lo antes posible. Si es casi la hora de la prxima dosis, tome slo esa dosis. No tome dosis adicionales o dobles. Qu puede interactuar con este medicamento? No tome este medicamento con ninguno de los siguientes frmacos: lincomicina Este medicamento tambin puede interactuar con los siguientes medicamentos: amiodarona anticidos pldoras anticonceptivas ciclosporina digoxina magnesio nelfinavir fenitona warfarina Puede ser que esta lista no menciona todas las posibles interacciones. Informe a su profesional de [employer] de [employer] productos a base de hierbas, medicamentos de Stewart o suplementos nutritivos que est tomando. Si usted fuma, consume bebidas alcohlicas o si utiliza drogas ilegales, indqueselo tambin a su profesional de [employer]. Algunas sustancias pueden interactuar con su medicamento. A qu debo estar atento al usar P[employer] Corporation? Informe a su mdico o a su  profesional de la salud si sus sntomas no comienzan a mejorar o si empeoran. No trate la diarrea con productos de [employer]. Contacte a su mdico si tiene diarrea por ms de [address], o si es grave y [place]. Este medicamento puede aumentar su sensibilidad al sol. Evite la [employer]. Si no la Network engineer, utilice ropa protectora y crema de Orthoptist. No utilice lmparas solares, camas solares ni cabinas solares. Qu efectos secundarios puedo tener al [employer] este medicamento? Efectos secundarios que debe informar a su mdico o a Producer, television/film/video de la salud tan pronto como sea posible: Therapist, art, como erupcin cutnea, picazn o urticarias, e hinchazn de la cara, los labios o la lengua confusin, pesadillas o alucinaciones orina oscura dificultad al respirar prdida de la audicin ritmo cardiaco irregular o dolor en el pecho dolor o dificultad para Recruitment consultant, formacin de ampollas, descamacin o distensin de la piel, incluso dentro de la boca manchas blancas o llagas en la boca color amarillento de los ojos o la piel Efectos secundarios que generalmente no requieren atencin mdica (infrmelos a su mdico o a Producer, television/film/video de la salud si persisten o si son molestos): diarrea mareos, somnolencia dolor de Secretary/administrator o vmitos decoloracin de los dientes irritacin vaginal Puede ser que esta lista no menciona todos los posibles efectos secundarios. Comunquese a su mdico por asesoramiento mdico [employer]. Usted puede informar  los efectos secundarios a la FDA por telfono al 1-800-FDA-1088. Dnde debo guardar mi medicina? Mantngala fuera del alcance de los nios. Gurdela a San[employer]SCI, entre 15 y 30 grados C (75 y 68 grados F). Deseche todo el medicamento que no haya utilizado, despus de la fecha de vencimiento. ATENCIN: Este folleto es un resumen. Puede ser que no cubra toda la posible informacin. Si usted tiene preguntas  acerca de esta medicina, consulte con su mdico, su farmacutico o su profesional de Radiographer, therapeutic.  2018 Elsevier/Gold Standard (2016-05-31 00:00:00)   IF you received an x-ray today, you will receive an invoice from [facility]. Please contact [facility] at [phone] with questions or concerns regarding your invoice.   IF you received labwork today, you will receive an invoice from [facility]. Please contact [facility] at [phone] with questions or concerns regarding your invoice.   Our billing staff will not be able to assist you with questions regarding bills from these companies.  You will be contacted with the lab results as soon as they are available. The fastest way to get your results is to activate your My Chart account. Instructions are located on the last page of this paperwork. If you have not heard from [place] regarding the results in 2 weeks, please contact this office.

## 2016-12-03 NOTE — Progress Notes (Signed)
   MRN: [id number] DOB: 07/19/1965  Subjective:   Cheryl Booth is a 51 y.o. female presenting for chief complaint of Laryngitis (x 3 weeks, after air conditoner was being fixed) and Cough (dry non productive)  Reports 3 week history of dry cough, sore throat, hoarseness, scratchy throat. Also has had nasal congestion, left ear discomfort. Has used salt gargles, benadryl. Denies fever, sinus pain, ear drainage, chest pain, shob, wheezing, n/v, abdominal pain, rashes. Denies smoking cigarettes or drinking alcohol. Patient has difficult time with allergies, very allergic to mold.  Cheryl Booth has a current medication list which includes the following prescription(s): digestive enzymes. Also is allergic to aspirin; nsaids; ciprofloxacin; codeine; hycodan [hydrocodone-homatropine]; and levaquin [levofloxacin in d5w].  Cheryl Booth  has a past medical history of Hiatal hernia and NSVD (normal spontaneous vaginal delivery). Also  has a past surgical history that includes Dilation and curettage of uterus and Abdominal hysterectomy.  Objective:   Vitals: BP 122/66   Pulse 70   Temp 98.7 F (37.1 C) (Oral)   Resp 18   Ht 5' (1.524 m)   Wt 165 lb (74.8 kg)   LMP 04/22/2005   SpO2 97%   BMI 32.22 kg/m   Physical Exam  Constitutional: She is oriented to person, place, and time. She appears well-developed and well-nourished.  HENT:  TM's intact bilaterally, no effusions or erythema. Nasal turbinates pink and moist, nasal passages patent. No sinus tenderness. Oropharynx with post-nasal drainage, mucous membranes moist.  Eyes: Right eye exhibits no discharge. Left eye exhibits no discharge.  Neck: Normal range of motion. Neck supple.  Cardiovascular: Normal rate, regular rhythm and intact distal pulses.  Exam reveals no gallop and no friction rub.   No murmur heard. Pulmonary/Chest: No respiratory distress. She has no wheezes. She has no rales.  Lymphadenopathy:    She has cervical adenopathy (bilateral).    Neurological: She is alert and oriented to person, place, and time.  Skin: Skin is warm and dry.  Psychiatric: She has a normal mood and affect.   Assessment and Plan :   1. Cough 2. Sore throat - I suspect patient has residual cough from viral illness. I offered patient cough suppression but she declined due to the potential for side effects with Tessalon and known intolerance to hydrocodone. I do not believe she needs azithromycin due to clear lung sounds throughout on physical exam. I offered patient chest x-ray, she declined. Given her post-nasal drainage and history of difficulty with allergies, I recommended supportive care with Zyrtec and Sudafed. Also recommended honey-based tea. Patient will try this and rtc if no improvement in 1 week.  Wallis BambergMario Arrow Tomko, PA-C [facility] [phone] 12/03/2016  3:26 PM

## 2016-12-06 ENCOUNTER — Ambulatory Visit (INDEPENDENT_AMBULATORY_CARE_PROVIDER_SITE_OTHER): Payer: BLUE CROSS/BLUE SHIELD

## 2016-12-06 ENCOUNTER — Ambulatory Visit (INDEPENDENT_AMBULATORY_CARE_PROVIDER_SITE_OTHER): Payer: BLUE CROSS/BLUE SHIELD | Admitting: Family Medicine

## 2016-12-06 ENCOUNTER — Encounter: Payer: Self-pay | Admitting: Family Medicine

## 2016-12-06 VITALS — BP 129/83 | HR 78 | Temp 98.5°F | Resp 17 | Ht 60.0 in | Wt 162.0 lb

## 2016-12-06 DIAGNOSIS — Z131 Encounter for screening for diabetes mellitus: Secondary | ICD-10-CM | POA: Diagnosis not present

## 2016-12-06 DIAGNOSIS — J9801 Acute bronchospasm: Secondary | ICD-10-CM | POA: Diagnosis not present

## 2016-12-06 DIAGNOSIS — R05 Cough: Secondary | ICD-10-CM | POA: Diagnosis not present

## 2016-12-06 DIAGNOSIS — R0602 Shortness of breath: Secondary | ICD-10-CM

## 2016-12-06 DIAGNOSIS — R059 Cough, unspecified: Secondary | ICD-10-CM

## 2016-12-06 LAB — POCT CBC
GRANULOCYTE PERCENT: 59 % (ref 37–80)
HEMATOCRIT: 40.4 % (ref 37.7–47.9)
Hemoglobin: 14 g/dL (ref 12.2–16.2)
Lymph, poc: 3 (ref 0.6–3.4)
MCH, POC: 29.4 pg (ref 27–31.2)
MCHC: 34.6 g/dL (ref 31.8–35.4)
MCV: 85 fL (ref 80–97)
MID (CBC): 0.5 (ref 0–0.9)
MPV: 6.8 fL (ref 0–99.8)
POC GRANULOCYTE: 5.1 (ref 2–6.9)
POC LYMPH PERCENT: 34.8 %L (ref 10–50)
POC MID %: 6.2 % (ref 0–12)
Platelet Count, POC: 340 10*3/uL (ref 142–424)
RBC: 4.76 M/uL (ref 4.04–5.48)
RDW, POC: 12.2 %
WBC: 8.7 10*3/uL (ref 4.6–10.2)

## 2016-12-06 LAB — GLUCOSE, POCT (MANUAL RESULT ENTRY): POC Glucose: 83 mg/dl (ref 70–99)

## 2016-12-06 MED ORDER — AMOXICILLIN-POT CLAVULANATE 875-125 MG PO TABS: 1.0000 | ORAL_TABLET | Freq: Two times a day (BID) | ORAL | 0 refills | Status: DC

## 2016-12-06 MED ORDER — PREDNISONE 20 MG PO TABS: ORAL_TABLET | ORAL | 0 refills | Status: DC

## 2016-12-06 NOTE — Patient Instructions (Addendum)
   IF you received an x-ray today, you will receive an invoice from [facility]. Please contact [facility] at [phone] with questions or concerns regarding your invoice.   IF you received labwork today, you will receive an invoice from [facility]. Please contact [facility] at [phone] with questions or concerns regarding your invoice.   Our billing staff will not be able to assist you with questions regarding bills from these companies.  You will be contacted with the lab results as soon as they are available. The fastest way to get your results is to activate your My Chart account. Instructions are located on the last page of this paperwork. If you have not heard from us regarding the results in 2 weeks, please contact this office.     Allergic Rhinitis Allergic rhinitis is when the mucous membranes in the nose respond to allergens. Allergens are particles in the air that cause your body to have an allergic reaction. This causes you to release allergic antibodies. Through a chain of events, these eventually cause you to release histamine into the blood stream. Although meant to protect the body, it is this release of histamine that causes your discomfort, such as frequent sneezing, congestion, and an itchy, runny nose. What are the causes? Seasonal allergic rhinitis (hay fever) is caused by pollen allergens that may come from grasses, trees, and weeds. Year-round allergic rhinitis (perennial allergic rhinitis) is caused by allergens such as house dust mites, pet dander, and mold spores. What are the signs or symptoms?  Nasal stuffiness (congestion).  Itchy, runny nose with sneezing and tearing of the eyes. How is this diagnosed? Your health care provider can help you determine the allergen or allergens that trigger your symptoms. If you and your health care provider are unable to determine the allergen, skin or blood testing may be used. Your health care provider will  diagnose your condition after taking your health history and performing a physical exam. Your health care provider may assess you for other related conditions, such as asthma, pink eye, or an ear infection. How is this treated? Allergic rhinitis does not have a cure, but it can be controlled by:  Medicines that block allergy symptoms. These may include allergy shots, nasal sprays, and oral antihistamines.  Avoiding the allergen. Hay fever may often be treated with antihistamines in pill or nasal spray forms. Antihistamines block the effects of histamine. There are over-the-counter medicines that may help with nasal congestion and swelling around the eyes. Check with your health care provider before taking or giving this medicine. If avoiding the allergen or the medicine prescribed do not work, there are many new medicines your health care provider can prescribe. Stronger medicine may be used if initial measures are ineffective. Desensitizing injections can be used if medicine and avoidance does not work. Desensitization is when a patient is given ongoing shots until the body becomes less sensitive to the allergen. Make sure you follow up with your health care provider if problems continue. Follow these instructions at home: It is not possible to completely avoid allergens, but you can reduce your symptoms by taking steps to limit your exposure to them. It helps to know exactly what you are allergic to so that you can avoid your specific triggers. Contact a health care provider if:  You have a fever.  You develop a cough that does not stop easily (persistent).  You have shortness of breath.  You start wheezing.  Symptoms interfere with normal daily activities.   This information is not intended to replace advice given to you by your health care provider. Make sure you discuss any questions you have with your health care provider. Document Released: 01/23/2001 Document Revised: 12/30/2015 Document  Reviewed: 01/05/2013 Elsevier Interactive Patient Education  2017 Elsevier Inc.  

## 2016-12-06 NOTE — Progress Notes (Signed)
Subjective:    Patient ID: Cheryl Booth, female    DOB: 1965-10-10, 51 y.o.   MRN: [id number]  12/06/2016  Cough (not better, told to try cetirizine and sudafed and it helped a little, unable to breath and per pt she has green from chest)   HPI This 51 y.o. female presents for evaluation of three week of cough.  Onset [place] swollen glands [place] the beach.  Fixing the air condition; developed sore throat initially; glands are improved.  No fever/chills/sweats.  Fatigued.  Feels like cold.  +headache; +excessive nasal congestion; taking Sudafed with improvement.  Now mostly in chest.  No rhinorrhea.  +coughing.  Unable to get deep breath.  No wheezing.  +sputum green.  PND improving yet sudafed improved.  +SOB onset three days ago.  No vomiting.  Started Cetirizine three days ago.  Also took [place] with minimal improvement.  Son and husband also coughing.  Very allergic to mold.  Can get very sick.  No tobacco.  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     Review of Systems  Constitutional: Negative for chills, diaphoresis, fatigue and fever.  HENT: Positive for congestion, postnasal drip and voice change. Negative for ear pain, rhinorrhea, sinus pain, sinus pressure, sneezing, sore throat, tinnitus and trouble swallowing.   Respiratory: Positive for cough. Negative for shortness of breath and wheezing.   Cardiovascular: Negative for chest pain, palpitations and leg swelling.  Gastrointestinal: Negative for abdominal pain, constipation, diarrhea, nausea and vomiting.    Past Medical History:  Diagnosis Date  . Hiatal hernia   . NSVD (normal spontaneous vaginal delivery)    Past Surgical History:  Procedure Laterality Date  . ABDOMINAL HYSTERECTOMY     TVH/ A&P REPAIR  . DILATION AND CURETTAGE OF UTERUS     x2   Allergies  Allergen Reactions  . Aspirin Anaphylaxis and Hives  . Nsaids Anaphylaxis and Hives  . Ciprofloxacin   . Codeine Nausea And Vomiting  . Hycodan  [Hydrocodone-Homatropine] Nausea And Vomiting    Pt called back 07/31/11 and reported that she was vomiting from the hycodan  . Levaquin [Levofloxacin In D5w] Other (See Comments)    Joint pain, light headedness.    Social History   Social History  . Marital status: Married    Spouse name: N/A  . Number of children: N/A  . Years of education: N/A   Occupational History  . Not on file.   Social History Main Topics  . Smoking status: Never Smoker  . Smokeless tobacco: Never Used  . Alcohol use No  . Drug use: No  . Sexual activity: Yes    Birth control/ protection: Surgical     Comment: HYSTERECTOMY   Other Topics Concern  . Not on file   Social History Narrative  . No narrative on file   Family History  Problem Relation Age of Onset  . Hypertension Mother   . Cancer Maternal Aunt 60       OVARIAN ( DESEASED)  . Cancer Maternal Aunt 60       STOMACH (DESEASED)  . Cancer Maternal Aunt        THYROID (DESEASED)       Objective:    BP 129/83 (BP Location: Right Arm, Patient Position: Sitting, Cuff Size: Normal)   Pulse 78   Temp 98.5 F (36.9 C) (Oral)   Resp 17   Ht 5' (1.524 m)   Wt 162 lb (73.5 kg)  LMP 04/22/2005   SpO2 97%   BMI 31.64 kg/m  Physical Exam  Constitutional: She is oriented to person, place, and time. She appears well-developed and well-nourished. No distress.  HENT:  Head: Normocephalic and atraumatic.  Right Ear: External ear normal.  Left Ear: External ear normal.  Nose: Nose normal.  Mouth/Throat: Oropharynx is clear and moist. No oropharyngeal exudate.  Moderate drainage in OP.  Eyes: Pupils are equal, round, and reactive to light. Conjunctivae are normal.  Neck: Normal range of motion. Neck supple.  Cardiovascular: Normal rate, regular rhythm and normal heart sounds.  Exam reveals no gallop and no friction rub.   No murmur heard. Pulmonary/Chest: Effort normal and breath sounds normal. No respiratory distress. She has no  wheezes. She has no rales.  Lymphadenopathy:    She has cervical adenopathy.  Neurological: She is alert and oriented to person, place, and time.  Skin: She is not diaphoretic.  Psychiatric: She has a normal mood and affect. Her behavior is normal.  Nursing note and vitals reviewed.  Results for orders placed or performed in visit on 12/06/16  POCT CBC  Result Value Ref Range   WBC 8.7 4.6 - 10.2 K/uL   Lymph, poc 3.0 0.6 - 3.4   POC LYMPH PERCENT 34.8 10 - 50 %L   MID (cbc) 0.5 0 - 0.9   POC MID % 6.2 0 - 12 %M   POC Granulocyte 5.1 2 - 6.9   Granulocyte percent 59.0 37 - 80 %G   RBC 4.76 4.04 - 5.48 M/uL   Hemoglobin 14.0 12.2 - 16.2 g/dL   HCT, POC 11.940.4 14.737.7 - 47.9 %   MCV 85.0 80 - 97 fL   MCH, POC 29.4 27 - 31.2 pg   MCHC 34.6 31.8 - 35.4 g/dL   RDW, POC 82.912.2 %   Platelet Count, POC 340 142 - 424 K/uL   MPV 6.8 0 - 99.8 fL  POCT glucose (manual entry)  Result Value Ref Range   POC Glucose 83 70 - 99 mg/dl   Dg Chest 2 View  Result Date: 12/06/2016 CLINICAL DATA:  Cough.  Shortness of breath . EXAM: CHEST  2 VIEW COMPARISON:  Chest x-ray 07/05/2014.  Chest x-ray 07/30/2011. FINDINGS: Mediastinum and hilar structures normal. Lungs are clear. Heart size normal. No pleural effusion or pneumothorax. Thoracolumbar spine scoliosis . IMPRESSION: No acute abnormality . Electronically Signed   By: Maisie Fushomas  Register   On: 12/06/2016 11:02       Assessment & Plan:   1. Cough   2. Shortness of breath   3. Screening for diabetes mellitus    -New onset bronchospasm with associated allergic rhinitis versus viral syndrome; rx for Prednisone provided.   -if no improvement in 5 days, start Augmentin for secondary infection. -continue Zyrtec and Pseudoephedrine.   Orders Placed This Encounter  Procedures  . DG Chest 2 View    Standing Status:   Future    Number of Occurrences:   1    Standing Expiration Date:   12/06/2017    Order Specific Question:   Reason for Exam (SYMPTOM  OR  DIAGNOSIS REQUIRED)    Answer:   cough, SOB    Order Specific Question:   Is the patient pregnant?    Answer:   No    Order Specific Question:   Preferred imaging location?    Answer:   External  . POCT CBC  . POCT glucose (manual entry)   Meds ordered  this encounter  Medications  . predniSONE (DELTASONE) 20 MG tablet    Sig: Two tablets daily x 5 days then one tablet daily x 2 days    Dispense:  12 tablet    Refill:  0  . amoxicillin-clavulanate (AUGMENTIN) 875-125 MG tablet    Sig: Take 1 tablet by mouth 2 (two) times daily.    Dispense:  20 tablet    Refill:  0    No Follow-up on file.   Priyansh Pry Paulita FujitaMartin Nura Cahoon, M.D. [facility]  [facility] [address] [place], [place]  [id number] [phone] phone [phone] fax

## 2017-01-01 ENCOUNTER — Telehealth: Payer: Self-pay | Admitting: Physician Assistant

## 2017-01-01 ENCOUNTER — Ambulatory Visit (INDEPENDENT_AMBULATORY_CARE_PROVIDER_SITE_OTHER): Payer: BLUE CROSS/BLUE SHIELD | Admitting: Physician Assistant

## 2017-01-01 ENCOUNTER — Encounter: Payer: Self-pay | Admitting: Physician Assistant

## 2017-01-01 ENCOUNTER — Ambulatory Visit (INDEPENDENT_AMBULATORY_CARE_PROVIDER_SITE_OTHER): Payer: BLUE CROSS/BLUE SHIELD

## 2017-01-01 VITALS — BP 130/76 | HR 82 | Temp 98.3°F | Resp 18

## 2017-01-01 DIAGNOSIS — S99911A Unspecified injury of right ankle, initial encounter: Secondary | ICD-10-CM | POA: Diagnosis not present

## 2017-01-01 DIAGNOSIS — S93401A Sprain of unspecified ligament of right ankle, initial encounter: Secondary | ICD-10-CM | POA: Diagnosis not present

## 2017-01-01 DIAGNOSIS — M25571 Pain in right ankle and joints of right foot: Secondary | ICD-10-CM

## 2017-01-01 DIAGNOSIS — M7989 Other specified soft tissue disorders: Secondary | ICD-10-CM | POA: Diagnosis not present

## 2017-01-01 NOTE — Telephone Encounter (Signed)
Left a message stating that work note is ready for her to pick up at 104. Advised to call back with any questions.

## 2017-01-01 NOTE — Patient Instructions (Addendum)
You can take tylenol for pain Please ice the ankle three times per day for 15 minutes Rest, Ice, compress, and elevate the foot.    Ankle Sprain, Phase I Rehab Ask your health care provider which exercises are safe for you. Do exercises exactly as told by your health care provider and adjust them as directed. It is normal to feel mild stretching, pulling, tightness, or discomfort as you do these exercises, but you should stop right away if you feel sudden pain or your pain gets worse.Do not begin these exercises until told by your health care provider. Stretching and range of motion exercises These exercises warm up your muscles and joints and improve the movement and flexibility of your lower leg and ankle. These exercises also help to relieve pain and stiffness. Exercise A: Gastroc and soleus stretch  1. Sit on the floor with your left / right leg extended. 2. Loop a belt or towel around the ball of your left / right foot. The ball of your foot is on the walking surface, right under your toes. 3. Keep your left / right ankle and foot relaxed and keep your knee straight while you use the belt or towel to pull your foot toward you. You should feel a gentle stretch behind your calf or knee. 4. Hold this position for __________ seconds, then release to the starting position. Repeat the exercise with your knee bent. You can put a pillow or a rolled bath towel under your knee to support it. You should feel a stretch deep in your calf or at your Achilles tendon. Repeat each stretch __________ times. Complete these stretches __________ times a day. Exercise B: Ankle alphabet  1. Sit with your left / right leg supported at the lower leg. ? Do not rest your foot on anything. ? Make sure your foot has room to move freely. 2. Think of your left / right foot as a paintbrush, and move your foot to trace each letter of the alphabet in the air. Keep your hip and knee still while you trace. Make the letters  as large as you can without feeling discomfort. 3. Trace every letter from A to Z. Repeat __________ times. Complete this exercise __________ times a day. Strengthening exercises These exercises build strength and endurance in your ankle and lower leg. Endurance is the ability to use your muscles for a long time, even after they get tired. Exercise C: Dorsiflexors  1. Secure a rubber exercise band or tube to an object, such as a table leg, that will stay still when the band is pulled. Secure the other end around your left / right foot. 2. Sit on the floor facing the object, with your left / right leg extended. The band or tube should be slightly tense when your foot is relaxed. 3. Slowly bring your foot toward you, pulling the band tighter. 4. Hold this position for __________ seconds. 5. Slowly return your foot to the starting position. Repeat __________ times. Complete this exercise __________ times a day. Exercise D: Plantar flexors  1. Sit on the floor with your left / right leg extended. 2. Loop a rubber exercise tube or band around the ball of your left / right foot. The ball of your foot is on the walking surface, right under your toes. ? Hold the ends of the band or tube in your hands. ? The band or tube should be slightly tense when your foot is relaxed. 3. Slowly point your foot and  toes downward, pushing them away from you. 4. Hold this position for __________ seconds. 5. Slowly return your foot to the starting position. Repeat __________ times. Complete this exercise __________ times a day. Exercise E: Evertors 1. Sit on the floor with your legs straight out in front of you. 2. Loop a rubber exercise band or tube around the ball of your left / right foot. The ball of your foot is on the walking surface, right under your toes. ? Hold the ends of the band in your hands, or secure the band to a stable object. ? The band or tube should be slightly tense when your foot is  relaxed. 3. Slowly push your foot outward, away from your other leg. 4. Hold this position for __________ seconds. 5. Slowly return your foot to the starting position. Repeat __________ times. Complete this exercise __________ times a day. This information is not intended to replace advice given to you by your health care provider. Make sure you discuss any questions you have with your health care provider. Document Released: 11/29/2004 Document Revised: 01/05/2016 Document Reviewed: 03/14/2015 Elsevier Interactive Patient Education  2018 [employer].     IF you received an x-ray today, you will receive an invoice from [facility]. Please contact [facility] at [phone] with questions or concerns regarding your invoice.   IF you received labwork today, you will receive an invoice from [facility]. Please contact [facility] at [phone] with questions or concerns regarding your invoice.   Our billing staff will not be able to assist you with questions regarding bills from these companies.  You will be contacted with the lab results as soon as they are available. The fastest way to get your results is to activate your My Chart account. Instructions are located on the last page of this paperwork. If you have not heard from [place] regarding the results in 2 weeks, please contact this office.

## 2017-01-01 NOTE — Telephone Encounter (Signed)
PATIENT STATES SHE WAS SEEN TODAY (01/01/17) BY STEPHANIE ENGLISH. SHE WAS DIAGNOSED WITH A SPRAINED ANKLE. 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 SHE IS ON [employer] AND SHE FORGOT TO ASK FOR A WORK NOTE TO BE OUT OF WORK. SHE NEEDS TO RETURN IN A WEEK FOR A REPEAT X-RAY BECAUSE HER ANKLE WAS TOO SWOLLEN TO TELL IF IT WAS BROKEN. PLEASE CALL HER WHEN SHE CAN PICK THE NOTE UP. BEST PHONE [phone] (CELL) MBC

## 2017-01-04 DIAGNOSIS — M25571 Pain in right ankle and joints of right foot: Secondary | ICD-10-CM | POA: Diagnosis not present

## 2017-01-04 DIAGNOSIS — S93491A Sprain of other ligament of right ankle, initial encounter: Secondary | ICD-10-CM | POA: Diagnosis not present

## 2017-01-08 ENCOUNTER — Ambulatory Visit: Payer: BLUE CROSS/BLUE SHIELD | Admitting: Physician Assistant

## 2017-01-12 NOTE — Progress Notes (Signed)
PRIMARY CARE AT [facility] [address], [place] [address] [phone]  Date:  01/01/2017   Name:  Cheryl Booth   DOB:  Apr 27, 1966   MRN:  [id number]  PCP:  Ethelda Chick, MD    History of Present Illness:  Cheryl Booth is a 51 y.o. female patient who presents to PCP with  Chief Complaint  Patient presents with  . Ankle Injury    X today- right ankle     Patient missed a step today, and then as she stood, she inverted her right ankle.  She thought she felt a pop.  She complains of swelling, and inability to ambulate.  She has no numbness.  She recalls that she may have injured this foot prior.  She has iced.  Patient Active Problem List   Diagnosis Date Noted  . Sebaceous cyst of skin of left breast 07/19/2016  . Midline cystocele 05/04/2016  . History of colon polyps 04/24/2012  . Hiatal hernia 04/24/2012    Past Medical History:  Diagnosis Date  . Hiatal hernia   . NSVD (normal spontaneous vaginal delivery)     Past Surgical History:  Procedure Laterality Date  . ABDOMINAL HYSTERECTOMY     TVH/ A&P REPAIR  . DILATION AND CURETTAGE OF UTERUS     x2    Social History  Substance Use Topics  . Smoking status: Never Smoker  . Smokeless tobacco: Never Used  . Alcohol use No    Family History  Problem Relation Age of Onset  . Hypertension Mother   . Cancer Maternal Aunt 60       OVARIAN ( DESEASED)  . Cancer Maternal Aunt 60       STOMACH (DESEASED)  . Cancer Maternal Aunt        THYROID (DESEASED)    Allergies  Allergen Reactions  . Aspirin Anaphylaxis and Hives  . Nsaids Anaphylaxis and Hives  . Ciprofloxacin   . Codeine Nausea And Vomiting  . Hycodan [Hydrocodone-Homatropine] Nausea And Vomiting    Pt called back 07/31/11 and reported that she was vomiting from the hycodan  . Levaquin [Levofloxacin In D5w] Other (See Comments)    Joint pain, light headedness.    Medication list has been reviewed and updated.  Current Outpatient Prescriptions  on File Prior to Visit  Medication Sig Dispense Refill  . amoxicillin-clavulanate (AUGMENTIN) 875-125 MG tablet Take 1 tablet by mouth 2 (two) times daily. (Patient not taking: Reported on 01/01/2017) 20 tablet 0  . cetirizine (ZYRTEC) 10 MG tablet Take 1 tablet (10 mg total) by mouth daily. (Patient not taking: Reported on 01/01/2017) 30 tablet 11  . Digestive Enzymes (DIGESTIVE ENZYME PO) Take 1 each by mouth once. Reported on 08/08/2015    . predniSONE (DELTASONE) 20 MG tablet Two tablets daily x 5 days then one tablet daily x 2 days (Patient not taking: Reported on 01/01/2017) 12 tablet 0  . pseudoephedrine (SUDAFED 12 HOUR) 120 MG 12 hr tablet Take 1 tablet (120 mg total) by mouth 2 (two) times daily. (Patient not taking: Reported on 01/01/2017) 30 tablet 3   No current facility-administered medications on file prior to visit.     ROS ROS otherwise unremarkable unless listed above.  Physical Examination: BP 130/76 (BP Location: Left Arm, Cuff Size: Normal)   Pulse 82   Temp 98.3 F (36.8 C) (Oral)   Resp 18   LMP 04/22/2005   SpO2 96%  Ideal Body Weight:    Physical  Exam  Constitutional: She is oriented to person, place, and time. She appears well-developed and well-nourished. No distress.  HENT:  Head: Normocephalic and atraumatic.  Right Ear: External ear normal.  Left Ear: External ear normal.  Eyes: Pupils are equal, round, and reactive to light. Conjunctivae and EOM are normal.  Cardiovascular: Normal rate.   Pulmonary/Chest: Effort normal. No respiratory distress.  Musculoskeletal:       Right ankle: She exhibits decreased range of motion and swelling. Tenderness. Lateral malleolus tenderness found. No medial malleolus tenderness found.  tender over the base of the anterior portion of the lateral malleolus. pain with eversion   Neurological: She is alert and oriented to person, place, and time.  Skin: She is not diaphoretic.  Psychiatric: She has a normal mood and  affect. Her behavior is normal.   Dg Ankle Complete Right  Result Date: 01/01/2017 CLINICAL DATA:  Right lateral ankle swelling status post fall EXAM: RIGHT ANKLE - COMPLETE 3+ VIEW COMPARISON:  04/03/2013 FINDINGS: No fracture or dislocation is seen. The ankle mortise is intact. The base of the fifth metatarsal is unremarkable. Mild dorsal/lateral soft tissue swelling. IMPRESSION: No fracture or dislocation is seen. Mild soft tissue swelling. Electronically Signed   By: Charline BillsSriyesh  Krishnan M.D.   On: 01/01/2017 11:15   Assessment and Plan: Cheryl Booth is a 51 y.o. female who is here today for right ankle injury Acute right ankle pain - Plan: DG Ankle Complete Right  Sprain of right ankle, unspecified ligament, initial encounter  Trena PlattStephanie Yuma Blucher, PA-C [facility] 9/1/20188:23 AM

## 2017-03-27 DIAGNOSIS — Z23 Encounter for immunization: Secondary | ICD-10-CM | POA: Diagnosis not present

## 2017-04-11 DIAGNOSIS — R194 Change in bowel habit: Secondary | ICD-10-CM | POA: Diagnosis not present

## 2017-04-11 DIAGNOSIS — E669 Obesity, unspecified: Secondary | ICD-10-CM | POA: Diagnosis not present

## 2017-04-11 DIAGNOSIS — K5730 Diverticulosis of large intestine without perforation or abscess without bleeding: Secondary | ICD-10-CM | POA: Diagnosis not present

## 2017-04-11 DIAGNOSIS — K760 Fatty (change of) liver, not elsewhere classified: Secondary | ICD-10-CM | POA: Diagnosis not present

## 2017-04-29 ENCOUNTER — Encounter: Payer: Self-pay | Admitting: Gynecology

## 2017-04-29 DIAGNOSIS — Z1231 Encounter for screening mammogram for malignant neoplasm of breast: Secondary | ICD-10-CM | POA: Diagnosis not present

## 2017-05-01 ENCOUNTER — Ambulatory Visit: Payer: BLUE CROSS/BLUE SHIELD | Admitting: Women's Health

## 2017-05-01 ENCOUNTER — Encounter: Payer: Self-pay | Admitting: Women's Health

## 2017-05-01 VITALS — BP 122/80

## 2017-05-01 DIAGNOSIS — N898 Other specified noninflammatory disorders of vagina: Secondary | ICD-10-CM | POA: Diagnosis not present

## 2017-05-01 DIAGNOSIS — M545 Low back pain: Secondary | ICD-10-CM | POA: Diagnosis not present

## 2017-05-01 DIAGNOSIS — N3000 Acute cystitis without hematuria: Secondary | ICD-10-CM | POA: Diagnosis not present

## 2017-05-01 LAB — WET PREP FOR TRICH, YEAST, CLUE

## 2017-05-01 MED ORDER — SULFAMETHOXAZOLE-TRIMETHOPRIM 800-160 MG PO TABS: 1.0000 | ORAL_TABLET | Freq: Two times a day (BID) | ORAL | 0 refills | Status: DC

## 2017-05-01 NOTE — Progress Notes (Signed)
51 year old MHF G2 P1 presents with complaint of bladder/pelvic pressure, pain and burning with urination, denies pain at end of stream of urination. Low back pain on occasion. Minimal discharge, states has increased odor with urine. Denies fever. Hysterectomy on no HRT with no bleeding. Has had a UTI and states symptoms similar. History of hematuria with negative GU workup. Rare  sexual activity, preference.  Exam: Appears well. No CVAT back discomfort low sacral area, abdomen soft without rebound or radiation slight pressure sensation at suprapubic area. External genitalia within normal limits, speculum exam scant discharge without odor or erythema, wet prep negative. UA: +1 leukocytes, +2 blood, 6-10 WBCs, 10-20 rbc's, moderate bacteria, 6-10 squamous epithelials,  Probable UTI  Plan: Bactrim twice daily for 3 days prescription, proper use given and reviewed. Reviewed importance of increasing fluids, UTI prevention discussed. Urine culture pending. Instructed to call if continued problems.

## 2017-05-01 NOTE — Patient Instructions (Signed)

## 2017-05-02 ENCOUNTER — Telehealth: Payer: Self-pay | Admitting: *Deleted

## 2017-05-02 NOTE — Telephone Encounter (Signed)
Patient called stating the Bactrim prescribed on 05/01/17 visit caused her to feel tired and had heart palpitations, pt stopped mediation for the time. Feeling better today, will watch symptoms and follow up if needed.

## 2017-05-03 LAB — URINALYSIS W MICROSCOPIC + REFLEX CULTURE
Bilirubin Urine: NEGATIVE
GLUCOSE, UA: NEGATIVE
Hyaline Cast: NONE SEEN /LPF
Ketones, ur: NEGATIVE
NITRITES URINE, INITIAL: NEGATIVE
PH: 5.5 (ref 5.0–8.0)
Protein, ur: NEGATIVE
Specific Gravity, Urine: 1.025 (ref 1.001–1.03)

## 2017-05-03 LAB — CULTURE INDICATED

## 2017-05-03 LAB — URINE CULTURE
MICRO NUMBER: 81433519
SPECIMEN QUALITY: ADEQUATE

## 2017-05-08 ENCOUNTER — Encounter: Payer: Self-pay | Admitting: Obstetrics & Gynecology

## 2017-05-08 ENCOUNTER — Ambulatory Visit: Payer: BLUE CROSS/BLUE SHIELD | Admitting: Obstetrics & Gynecology

## 2017-05-08 VITALS — BP 124/70 | Ht 60.0 in | Wt 168.0 lb

## 2017-05-08 DIAGNOSIS — Z01411 Encounter for gynecological examination (general) (routine) with abnormal findings: Secondary | ICD-10-CM | POA: Diagnosis not present

## 2017-05-08 DIAGNOSIS — Z90710 Acquired absence of both cervix and uterus: Secondary | ICD-10-CM | POA: Diagnosis not present

## 2017-05-08 NOTE — Addendum Note (Signed)
Addended by: Berna SpareASTILLO, Monigue Spraggins A on: 05/08/2017 09:38 AM   Modules accepted: Orders

## 2017-05-08 NOTE — Patient Instructions (Signed)
1. Encounter for gynecological examination with abnormal finding Gynecologic exam status post vaginal hysterectomy and anterior-posterior repair.  Pap reflex done on the vaginal vault today.  Breast exam normal.  Will obtain report of most recent screening mammogram done December 2018 at [facility].  Seen by Dr. Collene Mares recently, screening colonoscopy will be done in 2021.  Health labs here today. - CBC - Comp Met (CMET) - Lipid panel - TSH - Vitamin D 1,25 dihydroxy  2. H/O total vaginal hysterectomy  Cheryl Booth, it was a pleasure meeting you today!  I will inform you of your results as soon as they are available.   Kegel Exercises Kegel exercises help strengthen the muscles that support the rectum, vagina, small intestine, bladder, and uterus. Doing Kegel exercises can help:  Improve bladder and bowel control.  Improve sexual response.  Reduce problems and discomfort during pregnancy.  Kegel exercises involve squeezing your pelvic floor muscles, which are the same muscles you squeeze when you try to stop the flow of urine. The exercises can be done while sitting, standing, or lying down, but it is best to vary your position. Phase 1 exercises 1. Squeeze your pelvic floor muscles tight. You should feel a tight lift in your rectal area. If you are a female, you should also feel a tightness in your vaginal area. Keep your stomach, buttocks, and legs relaxed. 2. Hold the muscles tight for up to 10 seconds. 3. Relax your muscles. Repeat this exercise 50 times a day or as many times as told by your health care provider. Continue to do this exercise for at least 4-6 weeks or for as long as told by your health care provider. This information is not intended to replace advice given to you by your health care provider. Make sure you discuss any questions you have with your health care provider. Document Released: 04/16/2012 Document Revised: 12/24/2015 Document Reviewed: 03/20/2015 Elsevier Interactive  Patient Education  2018 [place] Maintenance, Female Adopting a healthy lifestyle and getting preventive care can go a long way to promote health and wellness. Talk with your health care provider about what schedule of regular examinations is right for you. This is a good chance for you to check in with your provider about disease prevention and staying healthy. In between checkups, there are plenty of things you can do on your own. Experts have done a lot of research about which lifestyle changes and preventive measures are most likely to keep you healthy. Ask your health care provider for more information. Weight and diet Eat a healthy diet  Be sure to include plenty of vegetables, fruits, low-fat dairy products, and lean protein.  Do not eat a lot of foods high in solid fats, added sugars, or salt.  Get regular exercise. This is one of the most important things you can do for your health. ? Most adults should exercise for at least 150 minutes each week. The exercise should increase your heart rate and make you sweat (moderate-intensity exercise). ? Most adults should also do strengthening exercises at least twice a week. This is in addition to the moderate-intensity exercise.  Maintain a healthy weight  Body mass index (BMI) is a measurement that can be used to identify possible weight problems. It estimates body fat based on height and weight. Your health care provider can help determine your BMI and help you achieve or maintain a healthy weight.  For females 2 years of age and older: ? A BMI below 18.5 is  considered underweight. ? A BMI of 18.5 to 24.9 is normal. ? A BMI of 25 to 29.9 is considered overweight. ? A BMI of 30 and above is considered obese.  Watch levels of cholesterol and blood lipids  You should start having your blood tested for lipids and cholesterol at 53 years of age, then have this test every 5 years.  You may need to have your cholesterol levels  checked more often if: ? Your lipid or cholesterol levels are high. ? You are older than 53 years of age. ? You are at high risk for heart disease.  Cancer screening Lung Cancer  Lung cancer screening is recommended for adults 34-34 years old who are at high risk for lung cancer because of a history of smoking.  A yearly low-dose CT scan of the lungs is recommended for people who: ? Currently smoke. ? Have quit within the past 15 years. ? Have at least a 30-pack-year history of smoking. A pack year is smoking an average of one pack of cigarettes a day for 1 year.  Yearly screening should continue until it has been 15 years since you quit.  Yearly screening should stop if you develop a health problem that would prevent you from having lung cancer treatment.  Breast Cancer  Practice breast self-awareness. This means understanding how your breasts normally appear and feel.  It also means doing regular breast self-exams. Let your health care provider know about any changes, no matter how small.  If you are in your 20s or 30s, you should have a clinical breast exam (CBE) by a health care provider every 1-3 years as part of a regular health exam.  If you are 72 or older, have a CBE every year. Also consider having a breast X-ray (mammogram) every year.  If you have a family history of breast cancer, talk to your health care provider about genetic screening.  If you are at high risk for breast cancer, talk to your health care provider about having an MRI and a mammogram every year.  Breast cancer gene (BRCA) assessment is recommended for women who have family members with BRCA-related cancers. BRCA-related cancers include: ? Breast. ? Ovarian. ? Tubal. ? Peritoneal cancers.  Results of the assessment will determine the need for genetic counseling and BRCA1 and BRCA2 testing.  Cervical Cancer Your health care provider may recommend that you be screened regularly for cancer of the  pelvic organs (ovaries, uterus, and vagina). This screening involves a pelvic examination, including checking for microscopic changes to the surface of your cervix (Pap test). You may be encouraged to have this screening done every 3 years, beginning at age 76.  For women ages 44-65, health care providers may recommend pelvic exams and Pap testing every 3 years, or they may recommend the Pap and pelvic exam, combined with testing for human papilloma virus (HPV), every 5 years. Some types of HPV increase your risk of cervical cancer. Testing for HPV may also be done on women of any age with unclear Pap test results.  Other health care providers may not recommend any screening for nonpregnant women who are considered low risk for pelvic cancer and who do not have symptoms. Ask your health care provider if a screening pelvic exam is right for you.  If you have had past treatment for cervical cancer or a condition that could lead to cancer, you need Pap tests and screening for cancer for at least 20 years after your treatment. If  Pap tests have been discontinued, your risk factors (such as having a new sexual partner) need to be reassessed to determine if screening should resume. Some women have medical problems that increase the chance of getting cervical cancer. In these cases, your health care provider may recommend more frequent screening and Pap tests.  Colorectal Cancer  This type of cancer can be detected and often prevented.  Routine colorectal cancer screening usually begins at 53 years of age and continues through 53 years of age.  Your health care provider may recommend screening at an earlier age if you have risk factors for colon cancer.  Your health care provider may also recommend using home test kits to check for hidden blood in the stool.  A small camera at the end of a tube can be used to examine your colon directly (sigmoidoscopy or colonoscopy). This is done to check for the  earliest forms of colorectal cancer.  Routine screening usually begins at age 67.  Direct examination of the colon should be repeated every 5-10 years through 53 years of age. However, you may need to be screened more often if early forms of precancerous polyps or small growths are found.  Skin Cancer  Check your skin from head to toe regularly.  Tell your health care provider about any new moles or changes in moles, especially if there is a change in a mole's shape or color.  Also tell your health care provider if you have a mole that is larger than the size of a pencil eraser.  Always use sunscreen. Apply sunscreen liberally and repeatedly throughout the day.  Protect yourself by wearing long sleeves, pants, a wide-brimmed hat, and sunglasses whenever you are outside.  Heart disease, diabetes, and high blood pressure  High blood pressure causes heart disease and increases the risk of stroke. High blood pressure is more likely to develop in: ? People who have blood pressure in the high end of the normal range (130-139/85-89 mm Hg). ? People who are overweight or obese. ? People who are African American.  If you are 50-72 years of age, have your blood pressure checked every 3-5 years. If you are 27 years of age or older, have your blood pressure checked every year. You should have your blood pressure measured twice-once when you are at a hospital or clinic, and once when you are not at a hospital or clinic. Record the average of the two measurements. To check your blood pressure when you are not at a hospital or clinic, you can use: ? An automated blood pressure machine at a pharmacy. ? A home blood pressure monitor.  If you are between 86 years and 35 years old, ask your health care provider if you should take aspirin to prevent strokes.  Have regular diabetes screenings. This involves taking a blood sample to check your fasting blood sugar level. ? If you are at a normal weight and  have a low risk for diabetes, have this test once every three years after 53 years of age. ? If you are overweight and have a high risk for diabetes, consider being tested at a younger age or more often. Preventing infection Hepatitis B  If you have a higher risk for hepatitis B, you should be screened for this virus. You are considered at high risk for hepatitis B if: ? You were born in a country where hepatitis B is common. Ask your health care provider which countries are considered high risk. ? Your  parents were born in a high-risk country, and you have not been immunized against hepatitis B (hepatitis B vaccine). ? You have HIV or AIDS. ? You use needles to inject street drugs. ? You live with someone who has hepatitis B. ? You have had sex with someone who has hepatitis B. ? You get hemodialysis treatment. ? You take certain medicines for conditions, including cancer, organ transplantation, and autoimmune conditions.  Hepatitis C  Blood testing is recommended for: ? Everyone born from [birth year] through 1967. ? Anyone with known risk factors for hepatitis C.  Sexually transmitted infections (STIs)  You should be screened for sexually transmitted infections (STIs) including gonorrhea and chlamydia if: ? You are sexually active and are younger than 53 years of age. ? You are older than 53 years of age and your health care provider tells you that you are at risk for this type of infection. ? Your sexual activity has changed since you were last screened and you are at an increased risk for chlamydia or gonorrhea. Ask your health care provider if you are at risk.  If you do not have HIV, but are at risk, it may be recommended that you take a prescription medicine daily to prevent HIV infection. This is called pre-exposure prophylaxis (PrEP). You are considered at risk if: ? You are sexually active and do not regularly use condoms or know the HIV status of your partner(s). ? You take drugs by  injection. ? You are sexually active with a partner who has HIV.  Talk with your health care provider about whether you are at high risk of being infected with HIV. If you choose to begin PrEP, you should first be tested for HIV. You should then be tested every 3 months for as long as you are taking PrEP. Pregnancy  If you are premenopausal and you may become pregnant, ask your health care provider about preconception counseling.  If you may become pregnant, take 400 to 800 micrograms (mcg) of folic acid every day.  If you want to prevent pregnancy, talk to your health care provider about birth control (contraception). Osteoporosis and menopause  Osteoporosis is a disease in which the bones lose minerals and strength with aging. This can result in serious bone fractures. Your risk for osteoporosis can be identified using a bone density scan.  If you are 35 years of age or older, or if you are at risk for osteoporosis and fractures, ask your health care provider if you should be screened.  Ask your health care provider whether you should take a calcium or vitamin D supplement to lower your risk for osteoporosis.  Menopause may have certain physical symptoms and risks.  Hormone replacement therapy may reduce some of these symptoms and risks. Talk to your health care provider about whether hormone replacement therapy is right for you. Follow these instructions at home:  Schedule regular health, dental, and eye exams.  Stay current with your immunizations.  Do not use any tobacco products including cigarettes, chewing tobacco, or electronic cigarettes.  If you are pregnant, do not drink alcohol.  If you are breastfeeding, limit how much and how often you drink alcohol.  Limit alcohol intake to no more than 1 drink per day for nonpregnant women. One drink equals 12 ounces of beer, 5 ounces of wine, or 1 ounces of hard liquor.  Do not use street drugs.  Do not share needles.  Ask  your health care provider for help if you need  support or information about quitting drugs.  Tell your health care provider if you often feel depressed.  Tell your health care provider if you have ever been abused or do not feel safe at home. This information is not intended to replace advice given to you by your health care provider. Make sure you discuss any questions you have with your health care provider. Document Released: 11/13/2010 Document Revised: 10/06/2015 Document Reviewed: 02/01/2015 Elsevier Interactive Patient Education  2018 [employer]  

## 2017-05-08 NOTE — Progress Notes (Signed)
  Cheryl Booth 09/29/1965 [id number]   History:    51 y.o. G2P1A1L1 Married  RP: Established patient presenting for annual gyn exam   HPI: Status post vaginal hysterectomy with anterior-posterior repair.  No pelvic pain.  Feels breast tenderness every months and very little hot flushes.  Not sexually active.  Urine and bowel movements normal.  Past medical history,surgical history, family history and social history were all reviewed and documented in the EPIC chart.  Gynecologic History Patient's last menstrual period was 04/22/2005. Contraception: status post hysterectomy Last Pap: 2012. Results were: normal Last mammogram: 04/2017. Results were: Will obtain report from [facility] 2011, benign polyp removed.  Seen by Dr. Mann who recommends screening colonoscopy in 2021.  Obstetric History OB History  Gravida Para Term Preterm AB Living  2 1 1   1 1  SAB TAB Ectopic Multiple Live Births  1       1    # Outcome Date GA Lbr Len/2nd Weight Sex Delivery Anes PTL Lv  2 SAB           1 Term     M Vag-Spont  N LIV       ROS: A ROS was performed and pertinent positives and negatives are included in the history.  GENERAL: No fevers or chills. HEENT: No change in vision, no earache, sore throat or sinus congestion. NECK: No pain or stiffness. CARDIOVASCULAR: No chest pain or pressure. No palpitations. PULMONARY: No shortness of breath, cough or wheeze. GASTROINTESTINAL: No abdominal pain, nausea, vomiting or diarrhea, melena or bright red blood per rectum. GENITOURINARY: No urinary frequency, urgency, hesitancy or dysuria. MUSCULOSKELETAL: No joint or muscle pain, no back pain, no recent trauma. DERMATOLOGIC: No rash, no itching, no lesions. ENDOCRINE: No polyuria, polydipsia, no heat or cold intolerance. No recent change in weight. HEMATOLOGICAL: No anemia or easy bruising or bleeding. NEUROLOGIC: No headache, seizures, numbness, tingling or weakness. PSYCHIATRIC: No depression, no loss  of interest in normal activity or change in sleep pattern.     Exam:   BP 124/70   Ht 5' (1.524 m)   Wt 168 lb (76.2 kg)   LMP 04/22/2005   BMI 32.81 kg/m   Body mass index is 32.81 kg/m.  General appearance : Well developed well nourished female. No acute distress HEENT: Eyes: no retinal hemorrhage or exudates,  Neck supple, trachea midline, no carotid bruits, no thyroidmegaly Lungs: Clear to auscultation, no rhonchi or wheezes, or rib retractions  Heart: Regular rate and rhythm, no murmurs or gallops Breast:Examined in sitting and supine position were symmetrical in appearance, no palpable masses or tenderness,  no skin retraction, no nipple inversion, no nipple discharge, no skin discoloration, no axillary or supraclavicular lymphadenopathy Abdomen: no palpable masses or tenderness, no rebound or guarding Extremities: no edema or skin discoloration or tenderness  Pelvic: Vulva normal  Bartholin, Urethra, Skene Glands: Within normal limits             Vagina: No gross lesions or discharge.  Pap reflex done.   Cervix/Uterus absent   Adnexa  Without masses or tenderness  Anus and perineum  normal              Rectal exam negative  Assessment/Plan:  51 y.o. female for annual exam   1. Encounter for gynecological examination with abnormal finding Gynecologic exam status post vaginal hysterectomy and anterior-posterior repair.  Pap reflex done on the vaginal vault today.  Breast exam normal.  Will obtain   report of most recent screening mammogram done December 2018 at [facility].  Seen by Dr. Mann recently, screening colonoscopy will be done in 2021.  Health labs here today. - CBC - Comp Met (CMET) - Lipid panel - TSH - Vitamin D 1,25 dihydroxy  2. H/O total vaginal hysterectomy   Marie-Lyne Lavoie MD, 8:59 AM 05/08/2017   

## 2017-05-11 LAB — CBC
HCT: 40.2 % (ref 35.0–45.0)
Hemoglobin: 13.7 g/dL (ref 11.7–15.5)
MCH: 29.2 pg (ref 27.0–33.0)
MCHC: 34.1 g/dL (ref 32.0–36.0)
MCV: 85.7 fL (ref 80.0–100.0)
MPV: 10 fL (ref 7.5–12.5)
PLATELETS: 315 10*3/uL (ref 140–400)
RBC: 4.69 10*6/uL (ref 3.80–5.10)
RDW: 11.9 % (ref 11.0–15.0)
WBC: 6.8 10*3/uL (ref 3.8–10.8)

## 2017-05-11 LAB — COMPREHENSIVE METABOLIC PANEL
AG Ratio: 1.6 (calc) (ref 1.0–2.5)
ALKALINE PHOSPHATASE (APISO): 85 U/L (ref 33–130)
ALT: 39 U/L — ABNORMAL HIGH (ref 6–29)
AST: 28 U/L (ref 10–35)
Albumin: 4.4 g/dL (ref 3.6–5.1)
BUN: 13 mg/dL (ref 7–25)
CALCIUM: 9.6 mg/dL (ref 8.6–10.4)
CHLORIDE: 102 mmol/L (ref 98–110)
CO2: 27 mmol/L (ref 20–32)
CREATININE: 0.6 mg/dL (ref 0.50–1.05)
GLOBULIN: 2.8 g/dL (ref 1.9–3.7)
Glucose, Bld: 101 mg/dL — ABNORMAL HIGH (ref 65–99)
Potassium: 4.1 mmol/L (ref 3.5–5.3)
Sodium: 139 mmol/L (ref 135–146)
Total Bilirubin: 0.5 mg/dL (ref 0.2–1.2)
Total Protein: 7.2 g/dL (ref 6.1–8.1)

## 2017-05-11 LAB — LIPID PANEL
Cholesterol: 208 mg/dL — ABNORMAL HIGH (ref <200)
HDL: 50 mg/dL — AB (ref >50)
LDL Cholesterol (Calc): 128 mg/dL (calc) — ABNORMAL HIGH
Non-HDL Cholesterol (Calc): 158 mg/dL (calc) — ABNORMAL HIGH (ref <130)
Total CHOL/HDL Ratio: 4.2 (calc) (ref <5.0)
Triglycerides: 182 mg/dL — ABNORMAL HIGH (ref <150)

## 2017-05-11 LAB — TSH: TSH: 1.21 mIU/L

## 2017-05-11 LAB — VITAMIN D 1,25 DIHYDROXY
Vitamin D 1, 25 (OH)2 Total: 50 pg/mL (ref 18–72)
Vitamin D3 1, 25 (OH)2: 50 pg/mL

## 2017-05-13 LAB — PAP IG W/ RFLX HPV ASCU

## 2017-06-13 ENCOUNTER — Ambulatory Visit: Payer: BLUE CROSS/BLUE SHIELD | Admitting: Obstetrics & Gynecology

## 2017-06-13 ENCOUNTER — Encounter: Payer: Self-pay | Admitting: Obstetrics & Gynecology

## 2017-06-13 VITALS — BP 126/84 | Ht 60.0 in | Wt 167.0 lb

## 2017-06-13 DIAGNOSIS — R87625 Unsatisfactory cytologic smear of vagina: Secondary | ICD-10-CM | POA: Diagnosis not present

## 2017-06-13 DIAGNOSIS — Z1211 Encounter for screening for malignant neoplasm of colon: Secondary | ICD-10-CM

## 2017-06-13 NOTE — Patient Instructions (Signed)
1. Pap smear of vagina unsatisfactory Pap test unsatisfactory with inflammation December 2018.  Status post total hysterectomy.  Pap test on vaginal vault repeated today.  No sign of vaginal infection.  Abstinent.  2. Colon cancer screening Colonoscopy normal in 2011.  Due for a repeat screening colonoscopy in 2021.  Rectal exam negative today. Hemoccult x3 given to patient.  Byrd HesselbachMaria, it was a pleasure seeing you today!  I will inform you of your results as soon as they are available.

## 2017-06-13 NOTE — Progress Notes (Signed)
    Cheryl Booth 1966/02/02 [id number]        52 y.o.  G2P1011 Married  RP: Unsatisfactory Pap with Inflammation 04/2017  HPI: Status post total hysterectomy.  Abstinent for many years.  No abnormal vaginal discharge.  No pelvic pain.  No problem with urine or bowel movements.  Last colonoscopy in 2011 was normal.  Would like to do Hemoccults x3.   OB History  Gravida Para Term Preterm AB Living  2 1 1   1 1   SAB TAB Ectopic Multiple Live Births  1       1    # Outcome Date GA Lbr Len/2nd Weight Sex Delivery Anes PTL Lv  2 SAB           1 Term     M Vag-Spont  N LIV      Past medical history,surgical history, problem list, medications, allergies, family history and social history were all reviewed and documented in the EPIC chart.   Directed ROS with pertinent positives and negatives documented in the history of present illness/assessment and plan.  Exam:  Vitals:   06/13/17 1002  BP: 126/84  Weight: 167 lb (75.8 kg)  Height: 5' (1.524 m)   General appearance:  Normal  Abdomen: Soft, not distended, nontender.  Gynecologic exam: Vulva normal.  Speculum: Cervix absent post total hysterectomy.  Vagina normal.  No abnormal vaginal secretions.  Pap test repeated from the vaginal vault.  Rectal exam: No nodule or mass felt.  Perianal area normal.   Assessment/Plan:  52 y.o. G2P1011   1. Pap smear of vagina unsatisfactory Pap test unsatisfactory with inflammation December 2018.  Status post total hysterectomy.  Pap test on vaginal vault repeated today.  No sign of vaginal infection.  Abstinent.  2. Colon cancer screening Colonoscopy normal in 2011.  Due for a repeat screening colonoscopy in 2021.  Rectal exam negative today. Hemoccult x3 given to patient.  Counseling on above issues more than 50% for 15 minutes.  Genia DelMarie-Lyne France Lusty MD, 10:10 AM 06/13/2017

## 2017-06-13 NOTE — Addendum Note (Signed)
Addended by: Berna SpareASTILLO, Durenda Pechacek A on: 06/13/2017 10:30 AM   Modules accepted: Orders

## 2017-06-18 LAB — PAP IG W/ RFLX HPV ASCU

## 2017-07-08 ENCOUNTER — Other Ambulatory Visit: Payer: Self-pay | Admitting: Anesthesiology

## 2017-07-08 DIAGNOSIS — Z1211 Encounter for screening for malignant neoplasm of colon: Secondary | ICD-10-CM

## 2017-07-09 ENCOUNTER — Other Ambulatory Visit: Payer: Self-pay | Admitting: Anesthesiology

## 2017-07-09 DIAGNOSIS — Z1211 Encounter for screening for malignant neoplasm of colon: Secondary | ICD-10-CM

## 2017-08-21 ENCOUNTER — Encounter: Payer: Self-pay | Admitting: Physician Assistant

## 2017-10-07 ENCOUNTER — Encounter: Payer: Self-pay | Admitting: Family Medicine

## 2018-04-21 ENCOUNTER — Encounter: Payer: Self-pay | Admitting: Family Medicine

## 2018-04-21 ENCOUNTER — Ambulatory Visit: Payer: BLUE CROSS/BLUE SHIELD | Admitting: Family Medicine

## 2018-04-21 ENCOUNTER — Other Ambulatory Visit: Payer: Self-pay

## 2018-04-21 VITALS — BP 131/78 | HR 77 | Temp 98.1°F | Ht 60.0 in | Wt 170.8 lb

## 2018-04-21 DIAGNOSIS — R945 Abnormal results of liver function studies: Secondary | ICD-10-CM | POA: Diagnosis not present

## 2018-04-21 DIAGNOSIS — E785 Hyperlipidemia, unspecified: Secondary | ICD-10-CM

## 2018-04-21 DIAGNOSIS — Z1211 Encounter for screening for malignant neoplasm of colon: Secondary | ICD-10-CM | POA: Diagnosis not present

## 2018-04-21 DIAGNOSIS — Z114 Encounter for screening for human immunodeficiency virus [HIV]: Secondary | ICD-10-CM | POA: Diagnosis not present

## 2018-04-21 NOTE — Patient Instructions (Addendum)
If you have lab work done today you will be contacted with your lab results within the next 2 weeks.  If you have not heard from us then please contact us. The fastest way to get your results is to register for My Chart.   IF you received an x-ray today, you will receive an invoice from [facility]. Please contact [facility] at [phone] with questions or concerns regarding your invoice.   IF you received labwork today, you will receive an invoice from [facility]. Please contact [facility] at [phone] with questions or concerns regarding your invoice.   Our billing staff will not be able to assist you with questions regarding bills from these companies.  You will be contacted with the lab results as soon as they are available. The fastest way to get your results is to activate your My Chart account. Instructions are located on the last page of this paperwork. If you have not heard from us regarding the results in 2 weeks, please contact this office.    Nonalcoholic Fatty Liver Disease Diet Nonalcoholic fatty liver disease is a condition that causes fat to accumulate in and around the liver. The disease makes it harder for the liver to work the way that it should. Following a healthy diet can help to keep nonalcoholic fatty liver disease under control. It can also help to prevent or improve conditions that are associated with the disease, such as heart disease, diabetes, high blood pressure, and abnormal cholesterol levels. Along with regular exercise, this diet:  Promotes weight loss.  Helps to control blood sugar levels.  Helps to improve the way that the body uses insulin.  What do I need to know about this diet?  Use the glycemic index (GI) to plan your meals. The index tells you how quickly a food will raise your blood sugar. Choose low-GI foods. These foods take a longer time to raise blood sugar.  Keep track of how many calories you take in. Eating the  right amount of calories will help you to achieve a healthy weight.  You may want to follow a Mediterranean diet. This diet includes a lot of vegetables, lean meats or fish, whole grains, fruits, and healthy oils and fats. What foods can I eat? Grains Whole grains, such as whole-wheat or whole-grain breads, crackers, tortillas, cereals, and pasta. Stone-ground whole wheat. Pumpernickel bread. Unsweetened oatmeal. Bulgur. Barley. Quinoa. Brown or wild rice. Corn or whole-wheat flour tortillas. Vegetables Lettuce. Spinach. Peas. Beets. Cauliflower. Cabbage. Broccoli. Carrots. Tomatoes. Squash. Eggplant. Herbs. Peppers. Onions. Cucumbers. Brussels sprouts. Yams and sweet potatoes. Beans. Lentils. Fruits Bananas. Apples. Oranges. Grapes. Papaya. Mango. Pomegranate. Kiwi. Grapefruit. Cherries. Meats and Other Protein Sources Seafood and shellfish. Lean meats. Poultry. Tofu. Dairy Low-fat or fat-free dairy products, such as yogurt, cottage cheese, and cheese. Beverages Water. Sugar-free drinks. Tea. Coffee. Low-fat or skim milk. Milk alternatives, such as soy or almond milk. Real fruit juice. Condiments Mustard. Relish. Low-fat, low-sugar ketchup and barbecue sauce. Low-fat or fat-free mayonnaise. Sweets and Desserts Sugar-free sweets. Fats and Oils Avocado. Canola or olive oil. Nuts and nut butters. Seeds. The items listed above may not be a complete list of recommended foods or beverages. Contact your dietitian for more options. What foods are not recommended? Palm oil and coconut oil. Processed foods. Fried foods. Sweetened drinks, such as sweet tea, milkshakes, snow cones, iced sweet drinks, and sodas. Alcohol. Sweets. Foods that contain a lot of salt or sodium. The items listed above  may not be a complete list of foods and beverages to avoid. Contact your dietitian for more information. This information is not intended to replace advice given to you by your health care provider. Make sure  you discuss any questions you have with your health care provider. Document Released: 09/14/2014 Document Revised: 10/06/2015 Document Reviewed: 05/25/2014 Elsevier Interactive Patient Education  [employer].

## 2018-04-21 NOTE — Progress Notes (Signed)
12/9/20199:30 AM  Jeanelle Malling 1966-01-02, 52 y.o. female [id number]  Chief Complaint  Patient presents with  . Transitions Of Care    no problems today    HPI:   Patient is a 52 y.o. female with past medical history significant for abnormal LFTs, HLP who presents today to establish care  Previous PCP Dr Tamala Julian - saw once a year Sees Dr Arlina Robes, gyn, [place] with vaginal pap, s/p hyst, Jan 2019 Colonoscopy in 2011, 2 sessile polyps - benign, repeat in 10 years  CT abd/pelvis in 2016 - fatty liver seen  Job requires her to walk all day Eats 2 small meals a day  Does not drink etoh or smoke Allergic to mold  Not sexually active  Has had flu vaccine this season  Fall Risk  04/21/2018 01/01/2017 12/06/2016 12/03/2016 08/08/2015  Falls in the past year? 0 Yes No No No  Number falls in past yr: - 1 - - -  Injury with Fall? - Yes - - -  Comment - right ankle - - -     Depression screen [facility] 2/9 04/21/2018 01/01/2017 12/06/2016  Decreased Interest 0 0 0  Down, Depressed, Hopeless 0 0 0  PHQ - 2 Score 0 0 0    Allergies  Allergen Reactions  . Aspirin Anaphylaxis and Hives  . Nsaids Anaphylaxis and Hives  . Ciprofloxacin   . Codeine Nausea And Vomiting  . Hycodan [Hydrocodone-Homatropine] Nausea And Vomiting    Pt called back 07/31/11 and reported that she was vomiting from the hycodan  . Levaquin [Levofloxacin In D5w] Other (See Comments)    Joint pain, light headedness.    Prior to Admission medications   Not on File    Past Medical History:  Diagnosis Date  . Hiatal hernia   . NSVD (normal spontaneous vaginal delivery)     Past Surgical History:  Procedure Laterality Date  . ABDOMINAL HYSTERECTOMY     TVH/ A&P REPAIR  . DILATION AND CURETTAGE OF UTERUS     x2    Social History   Tobacco Use  . Smoking status: Never Smoker  . Smokeless tobacco: Never Used  Substance Use Topics  . Alcohol use: No    Alcohol/week: 0.0 standard drinks    Comment: rare     Family History  Problem Relation Age of Onset  . Hypertension Mother   . Cancer Maternal Aunt 60       OVARIAN ( DESEASED)  . Cancer Maternal Aunt 60       STOMACH (DESEASED)  . Cancer Maternal Aunt        THYROID (DESEASED)    Review of Systems  Constitutional: Negative for chills and fever.  Respiratory: Negative for cough and shortness of breath.   Cardiovascular: Negative for chest pain, palpitations and leg swelling.  Gastrointestinal: Negative for abdominal pain, nausea and vomiting.     OBJECTIVE:  Blood pressure 131/78, pulse 77, temperature 98.1 F (36.7 C), temperature source Oral, height 5' (1.524 m), weight 170 lb 12.8 oz (77.5 kg), last menstrual period 04/22/2005, SpO2 97 %. Body mass index is 33.36 kg/m.   Wt Readings from Last 3 Encounters:  04/21/18 170 lb 12.8 oz (77.5 kg)  06/13/17 167 lb (75.8 kg)  05/08/17 168 lb (76.2 kg)    Physical Exam  Constitutional: She is oriented to person, place, and time. She appears well-developed and well-nourished.  HENT:  Head: Normocephalic and atraumatic.  Mouth/Throat: Oropharynx is clear and moist and  mucous membranes are normal. No oropharyngeal exudate. Tonsils are 1+ on the right. Tonsils are 1+ on the left.  Eyes: Pupils are equal, round, and reactive to light. Conjunctivae and EOM are normal. No scleral icterus.  Neck: Neck supple.  Cardiovascular: Normal rate, regular rhythm and normal heart sounds. Exam reveals no gallop and no friction rub.  No murmur heard. Pulmonary/Chest: Effort normal and breath sounds normal. She has no wheezes. She has no rales.  Abdominal: Soft. Bowel sounds are normal. She exhibits no distension. There is no hepatosplenomegaly. There is no tenderness.  Musculoskeletal: She exhibits no edema.  Neurological: She is alert and oriented to person, place, and time.  Skin: Skin is warm and dry.  Psychiatric: She has a normal mood and affect.  Nursing note and vitals  reviewed.   ASSESSMENT and PLAN  1. Hyperlipidemia, unspecified hyperlipidemia type Discussed LFM. Weight loss.  - TSH - Lipid panel - CMP14+EGFR  2. Abnormal liver function Minimal. Most likely from fatty liver. Cont to monitor. Discussed LFM, avoidance of liver toxic meds - TSH - CMP14+EGFR  3. Special screening for malignant neoplasms, colon Repeat colonoscopy in 2021  4. Screening for HIV (human immunodeficiency virus) - HIV Antibody (routine testing w rflx)   Return if symptoms worsen or fail to improve.    Rutherford Guys, MD [facility] [place], [place] [address] Ph.  [phone] Fax [phone]

## 2018-04-22 LAB — CMP14+EGFR
ALT: 50 IU/L — ABNORMAL HIGH (ref 0–32)
AST: 34 IU/L (ref 0–40)
Albumin/Globulin Ratio: 1.6 (ref 1.2–2.2)
Albumin: 4.5 g/dL (ref 3.5–5.5)
Alkaline Phosphatase: 89 IU/L (ref 39–117)
BUN/Creatinine Ratio: 24 — ABNORMAL HIGH (ref 9–23)
BUN: 16 mg/dL (ref 6–24)
Bilirubin Total: 0.5 mg/dL (ref 0.0–1.2)
CO2: 23 mmol/L (ref 20–29)
Calcium: 9.9 mg/dL (ref 8.7–10.2)
Chloride: 101 mmol/L (ref 96–106)
Creatinine, Ser: 0.67 mg/dL (ref 0.57–1.00)
GFR calc Af Amer: 115 mL/min/1.73
GFR calc non Af Amer: 100 mL/min/1.73
Globulin, Total: 2.8 g/dL (ref 1.5–4.5)
Glucose: 102 mg/dL — ABNORMAL HIGH (ref 65–99)
Potassium: 4.4 mmol/L (ref 3.5–5.2)
Sodium: 140 mmol/L (ref 134–144)
Total Protein: 7.3 g/dL (ref 6.0–8.5)

## 2018-04-22 LAB — LIPID PANEL
Chol/HDL Ratio: 4.2 ratio (ref 0.0–4.4)
Cholesterol, Total: 188 mg/dL (ref 100–199)
HDL: 45 mg/dL (ref >39)
LDL Calculated: 109 mg/dL — ABNORMAL HIGH (ref 0–99)
Triglycerides: 168 mg/dL — ABNORMAL HIGH (ref 0–149)
VLDL Cholesterol Cal: 34 mg/dL (ref 5–40)

## 2018-04-22 LAB — HIV ANTIBODY (ROUTINE TESTING W REFLEX): HIV Screen 4th Generation wRfx: NONREACTIVE

## 2018-04-22 LAB — TSH: TSH: 0.957 u[IU]/mL (ref 0.450–4.500)

## 2018-05-03 DIAGNOSIS — Z1231 Encounter for screening mammogram for malignant neoplasm of breast: Secondary | ICD-10-CM | POA: Diagnosis not present

## 2018-05-09 ENCOUNTER — Ambulatory Visit: Payer: BLUE CROSS/BLUE SHIELD | Admitting: Obstetrics & Gynecology

## 2018-05-09 ENCOUNTER — Encounter: Payer: Self-pay | Admitting: Obstetrics & Gynecology

## 2018-05-09 VITALS — BP 126/78 | Ht 60.0 in | Wt 169.0 lb

## 2018-05-09 DIAGNOSIS — Z01419 Encounter for gynecological examination (general) (routine) without abnormal findings: Secondary | ICD-10-CM

## 2018-05-09 DIAGNOSIS — Z90710 Acquired absence of both cervix and uterus: Secondary | ICD-10-CM

## 2018-05-09 DIAGNOSIS — E6609 Other obesity due to excess calories: Secondary | ICD-10-CM | POA: Diagnosis not present

## 2018-05-09 DIAGNOSIS — Z6833 Body mass index (BMI) 33.0-33.9, adult: Secondary | ICD-10-CM

## 2018-05-09 DIAGNOSIS — R8290 Unspecified abnormal findings in urine: Secondary | ICD-10-CM

## 2018-05-09 MED ORDER — SULFAMETHOXAZOLE-TRIMETHOPRIM 800-160 MG PO TABS: 1.0000 | ORAL_TABLET | Freq: Two times a day (BID) | ORAL | 0 refills | Status: AC

## 2018-05-09 NOTE — Progress Notes (Signed)
Cheryl Booth 22-Jun-1965 [id number]   History:    52 y.o. G2P1A1L1 Married  RP:  Established patient presenting for annual gyn exam   HPI: Status post total hysterectomy.  History of anterior and posterior repair.  No menopausal syndrome.  No pelvic pain.  Abstinent.  Complains of urine odor.  No burning with micturition or frequency of urination.  No fever.  No stress urinary incontinence.  Bowel movements normal.  Body mass index 33.01.  Physically active at work but no fitness activities.  Health labs done with family physician in December 2019, per patient the labs were done in a nonfasting state.  Past medical history,surgical history, family history and social history were all reviewed and documented in the EPIC chart.  Gynecologic History Patient's last menstrual period was 04/22/2005. Contraception: status post hysterectomy Last Pap: 05/2017. Results were: Negative Last mammogram: 04/2017. Results were: Negative.  [facility]. Bone Density: Never Colonoscopy: 2011, 10 yr schedule.  Hemocult card given today.  Obstetric History OB History  Gravida Para Term Preterm AB Living  2 1 1   1 1   SAB TAB Ectopic Multiple Live Births  1       1    # Outcome Date GA Lbr Len/2nd Weight Sex Delivery Anes PTL Lv  2 SAB           1 Term     M Vag-Spont  N LIV     ROS: A ROS was performed and pertinent positives and negatives are included in the history.  GENERAL: No fevers or chills. HEENT: No change in vision, no earache, sore throat or sinus congestion. NECK: No pain or stiffness. CARDIOVASCULAR: No chest pain or pressure. No palpitations. PULMONARY: No shortness of breath, cough or wheeze. GASTROINTESTINAL: No abdominal pain, nausea, vomiting or diarrhea, melena or bright red blood per rectum. GENITOURINARY: No urinary frequency, urgency, hesitancy or dysuria. MUSCULOSKELETAL: No joint or muscle pain, no back pain, no recent trauma. DERMATOLOGIC: No rash, no itching, no  lesions. ENDOCRINE: No polyuria, polydipsia, no heat or cold intolerance. No recent change in weight. HEMATOLOGICAL: No anemia or easy bruising or bleeding. NEUROLOGIC: No headache, seizures, numbness, tingling or weakness. PSYCHIATRIC: No depression, no loss of interest in normal activity or change in sleep pattern.     Exam:   BP 126/78   Ht 5' (1.524 m)   Wt 169 lb (76.7 kg)   LMP 04/22/2005   BMI 33.01 kg/m   Body mass index is 33.01 kg/m.  General appearance : Well developed well nourished female. No acute distress HEENT: Eyes: no retinal hemorrhage or exudates,  Neck supple, trachea midline, no carotid bruits, no thyroidmegaly Lungs: Clear to auscultation, no rhonchi or wheezes, or rib retractions  Heart: Regular rate and rhythm, no murmurs or gallops Breast:Examined in sitting and supine position were symmetrical in appearance, no palpable masses or tenderness,  no skin retraction, no nipple inversion, no nipple discharge, no skin discoloration, no axillary or supraclavicular lymphadenopathy Abdomen: no palpable masses or tenderness, no rebound or guarding Extremities: no edema or skin discoloration or tenderness  Pelvic: Vulva: Normal             Vagina: No gross lesions or discharge  Cervix/Uterus Absent  Adnexa  Without masses or tenderness  Anus: Normal  Urine analysis: Yellow cloudy, protein negative, nitrites negative, white blood cells 0-5, red blood cells 3-10, few bacteria.  Pending urine culture.   Assessment/Plan:  52 y.o. female  for annual exam   1. Well female exam with routine gynecological exam Gynecologic exam status post total hysterectomy.  Pap test negative in January 2019.  No indication to repeat this year.  Breast exam normal.  Screening mammogram in December 2018 was negative.  [facility].  Health labs done with family physician.  Hemoccult cards given.  2. S/P total hysterectomy  3. Abnormal urine odor Mildly perturbed  urine analysis.  Will wait for urine culture before starting treatment.  Prescription of Bactrim sent, to start per urine culture.  Recommend drinking plenty of water.  Will start probiotics.  4. Class 1 obesity due to excess calories without serious comorbidity with body mass index (BMI) of 33.0 to 33.9 in adult Recommend lower calorie/carb diet such as [employer] diet.  Aerobic physical activities 5 times a week and weightlifting every 2 days.  Other orders - sulfamethoxazole-trimethoprim (BACTRIM DS,SEPTRA DS) 800-160 MG tablet; Take 1 tablet by mouth 2 (two) times daily for 3 days.  Counseling on above issues and coordination of care more than 50% for 10 minutes.  Genia DelMarie-Lyne Ria Redcay MD, 8:43 AM 05/09/2018

## 2018-05-09 NOTE — Patient Instructions (Signed)
1. Well female exam with routine gynecological exam Gynecologic exam status post total hysterectomy.  Pap test negative in January 2019.  No indication to repeat this year.  Breast exam normal.  Screening mammogram in December 2018 was negative.  [facility].  Health labs done with family physician.  Hemoccult cards given.  2. S/P total hysterectomy  3. Abnormal urine odor Mildly perturbed urine analysis.  Will wait for urine culture before starting treatment.  Prescription of Bactrim sent, to start per urine culture.  Recommend drinking plenty of water.  Will start probiotics.  4. Class 1 obesity due to excess calories without serious comorbidity with body mass index (BMI) of 33.0 to 33.9 in adult Recommend lower calorie/carb diet such as [employer] diet.  Aerobic physical activities 5 times a week and weightlifting every 2 days.  Other orders - sulfamethoxazole-trimethoprim (BACTRIM DS,SEPTRA DS) 800-160 MG tablet; Take 1 tablet by mouth 2 (two) times daily for 3 days.  Cheryl Booth, it was a pleasure seeing you today!  I will inform you of your results as soon as they are available.

## 2018-05-09 NOTE — Addendum Note (Signed)
Addended by: Berna SpareASTILLO, Anahita Cua A on: 05/09/2018 12:43 PM   Modules accepted: Orders

## 2018-05-10 LAB — URINALYSIS, COMPLETE W/RFL CULTURE
Bilirubin Urine: NEGATIVE
GLUCOSE, UA: NEGATIVE
Hyaline Cast: NONE SEEN /LPF
Ketones, ur: NEGATIVE
NITRITES URINE, INITIAL: NEGATIVE
PH: 5 (ref 5.0–8.0)
PROTEIN: NEGATIVE
SPECIFIC GRAVITY, URINE: 1.015 (ref 1.001–1.03)

## 2018-05-10 LAB — URINE CULTURE
MICRO NUMBER:: 91545146
Result:: NO GROWTH
SPECIMEN QUALITY: ADEQUATE

## 2018-05-10 LAB — CULTURE INDICATED

## 2018-05-30 ENCOUNTER — Other Ambulatory Visit: Payer: Self-pay | Admitting: Anesthesiology

## 2018-05-30 ENCOUNTER — Encounter: Payer: Self-pay | Admitting: Anesthesiology

## 2018-05-30 DIAGNOSIS — Z1211 Encounter for screening for malignant neoplasm of colon: Secondary | ICD-10-CM

## 2018-07-31 ENCOUNTER — Ambulatory Visit: Payer: BLUE CROSS/BLUE SHIELD | Admitting: Family Medicine

## 2019-02-04 ENCOUNTER — Encounter: Payer: Self-pay | Admitting: Gynecology

## 2019-05-05 ENCOUNTER — Encounter: Payer: Self-pay | Admitting: Obstetrics & Gynecology

## 2019-05-05 DIAGNOSIS — Z1231 Encounter for screening mammogram for malignant neoplasm of breast: Secondary | ICD-10-CM | POA: Diagnosis not present

## 2019-05-19 ENCOUNTER — Other Ambulatory Visit: Payer: Self-pay

## 2019-05-20 ENCOUNTER — Encounter: Payer: Self-pay | Admitting: Obstetrics & Gynecology

## 2019-05-20 ENCOUNTER — Ambulatory Visit: Payer: BC Managed Care – PPO | Admitting: Obstetrics & Gynecology

## 2019-05-20 VITALS — BP 122/80 | Ht 60.0 in | Wt 174.0 lb

## 2019-05-20 DIAGNOSIS — Z90710 Acquired absence of both cervix and uterus: Secondary | ICD-10-CM | POA: Diagnosis not present

## 2019-05-20 DIAGNOSIS — R87625 Unsatisfactory cytologic smear of vagina: Secondary | ICD-10-CM

## 2019-05-20 DIAGNOSIS — Z780 Asymptomatic menopausal state: Secondary | ICD-10-CM

## 2019-05-20 DIAGNOSIS — Z01419 Encounter for gynecological examination (general) (routine) without abnormal findings: Secondary | ICD-10-CM | POA: Diagnosis not present

## 2019-05-20 DIAGNOSIS — Z6833 Body mass index (BMI) 33.0-33.9, adult: Secondary | ICD-10-CM

## 2019-05-20 DIAGNOSIS — E6609 Other obesity due to excess calories: Secondary | ICD-10-CM

## 2019-05-20 NOTE — Progress Notes (Signed)
Cheryl Booth 12-14-1965 [id number]   History:    54 y.o. G2P1A1L1 Married  RP:  Established patient presenting for annual gyn exam   HPI: Status post total hysterectomy.  History of anterior and posterior repair.  No menopausal syndrome.  No pelvic pain.  Pap neg 05/2017.  Abstinent. Breasts normal.  Will obtain report of Mammo 04/2019.  Urine normal.  No stress urinary incontinence.  Bowel movements normal.  Body mass index 33.98.  Physically active at work but no fitness activities.  F/U here for Fasting Health labs.  [place] to schedule, last 2011.   Past medical history,surgical history, family history and social history were all reviewed and documented in the EPIC chart.  Gynecologic History Patient's last menstrual period was 04/22/2005.  Obstetric History OB History  Gravida Para Term Preterm AB Living  _0 SAB TAB Ectopic Multiple Live Births  1       1    # Outcome Date GA Lbr Len/2nd Weight Sex Delivery Anes PTL Lv  2 SAB           1 Term     M Vag-Spont  N LIV     ROS: A ROS was performed and pertinent positives and negatives are included in the history.  GENERAL: No fevers or chills. HEENT: No change in vision, no earache, sore throat or sinus congestion. NECK: No pain or stiffness. CARDIOVASCULAR: No chest pain or pressure. No palpitations. PULMONARY: No shortness of breath, cough or wheeze. GASTROINTESTINAL: No abdominal pain, nausea, vomiting or diarrhea, melena or bright red blood per rectum. GENITOURINARY: No urinary frequency, urgency, hesitancy or dysuria. MUSCULOSKELETAL: No joint or muscle pain, no back pain, no recent trauma. DERMATOLOGIC: No rash, no itching, no lesions. ENDOCRINE: No polyuria, polydipsia, no heat or cold intolerance. No recent change in weight. HEMATOLOGICAL: No anemia or easy bruising or bleeding. NEUROLOGIC: No headache, seizures, numbness, tingling or weakness. PSYCHIATRIC: No depression, no loss of interest in normal activity or  change in sleep pattern.     Exam:   BP 122/80 (BP Location: Right Arm, Patient Position: Sitting, Cuff Size: Normal)   Ht 5' (1.524 m)   Wt 174 lb (78.9 kg)   LMP 04/22/2005   BMI 33.98 kg/m   Body mass index is 33.98 kg/m.  General appearance : Well developed well nourished female. No acute distress HEENT: Eyes: no retinal hemorrhage or exudates,  Neck supple, trachea midline, no carotid bruits, no thyroidmegaly Lungs: Clear to auscultation, no rhonchi or wheezes, or rib retractions  Heart: Regular rate and rhythm, no murmurs or gallops Breast:Examined in sitting and supine position were symmetrical in appearance, no palpable masses or tenderness,  no skin retraction, no nipple inversion, no nipple discharge, no skin discoloration, no axillary or supraclavicular lymphadenopathy Abdomen: no palpable masses or tenderness, no rebound or guarding Extremities: no edema or skin discoloration or tenderness  Pelvic: Vulva: Normal             Vagina: No gross lesions or discharge.  Pap reflex on the vaginal vault.  Cervix/Uterus absent  Adnexa  Without masses or tenderness  Anus: Normal   Assessment/Plan:  54 y.o. female for annual exam   1. Well female exam with routine gynecological exam Gynecologic exam status post hysterectomy.  Last Pap test was unsatisfactory, repeat Pap reflex done today on the vaginal vault.  Breast exam normal.  Will obtain report of screening mammogram done December 2020.  Colonoscopy to schedule this year.  We will follow-up here for fasting health labs. - Pap IG w/ reflex to HPV when ASC-U - CBC; Future - Comp Met (CMET); Future - TSH; Future - Lipid panel; Future - VITAMIN D 25 Hydroxy (Vit-D Deficiency, Fractures); Future  2. S/P total hysterectomy  3. Pap smear of vagina unsatisfactory Pap reflex repeated on the vaginal vault today.  4. Postmenopause Well on no hormone replacement therapy.  Recommend vitamin D supplements, calcium intake of  1200 mg daily and regular weightbearing physical activities.  5. Class 1 obesity due to excess calories with serious comorbidity and body mass index (BMI) of 33.0 to 33.9 in adult Body mass index at 33.98.  Recommend a low calorie/carb diet such as [employer].  Aerobic physical activities 5 times a week and weightlifting every 2 days.  Princess Bruins MD, 4:48 PM 05/20/2019

## 2019-05-21 ENCOUNTER — Other Ambulatory Visit: Payer: BC Managed Care – PPO

## 2019-05-21 ENCOUNTER — Other Ambulatory Visit: Payer: Self-pay

## 2019-05-21 DIAGNOSIS — R739 Hyperglycemia, unspecified: Secondary | ICD-10-CM | POA: Diagnosis not present

## 2019-05-21 DIAGNOSIS — Z01419 Encounter for gynecological examination (general) (routine) without abnormal findings: Secondary | ICD-10-CM | POA: Diagnosis not present

## 2019-05-21 DIAGNOSIS — E559 Vitamin D deficiency, unspecified: Secondary | ICD-10-CM | POA: Diagnosis not present

## 2019-05-21 DIAGNOSIS — E781 Pure hyperglyceridemia: Secondary | ICD-10-CM | POA: Diagnosis not present

## 2019-05-22 LAB — PAP IG W/ RFLX HPV ASCU

## 2019-05-25 ENCOUNTER — Encounter: Payer: Self-pay | Admitting: Obstetrics & Gynecology

## 2019-05-25 NOTE — Patient Instructions (Signed)
1. Well female exam with routine gynecological exam Gynecologic exam status post hysterectomy.  Last Pap test was unsatisfactory, repeat Pap reflex done today on the vaginal vault.  Breast exam normal.  Will obtain report of screening mammogram done December 2020.  Colonoscopy to schedule this year.  We will follow-up here for fasting health labs. - Pap IG w/ reflex to HPV when ASC-U - CBC; Future - Comp Met (CMET); Future - TSH; Future - Lipid panel; Future - VITAMIN D 25 Hydroxy (Vit-D Deficiency, Fractures); Future  2. S/P total hysterectomy  3. Pap smear of vagina unsatisfactory Pap reflex repeated on the vaginal vault today.  4. Postmenopause Well on no hormone replacement therapy.  Recommend vitamin D supplements, calcium intake of 1200 mg daily and regular weightbearing physical activities.  5. Class 1 obesity due to excess calories with serious comorbidity and body mass index (BMI) of 33.0 to 33.9 in adult Body mass index at 33.98.  Recommend a low calorie/carb diet such as [employer].  Aerobic physical activities 5 times a week and weightlifting every 2 days.  Cheryl Booth, it was a pleasure seeing you today!  I will inform you of your results as soon as they are available.

## 2019-05-27 LAB — COMPREHENSIVE METABOLIC PANEL
AG Ratio: 1.4 (calc) (ref 1.0–2.5)
ALT: 56 U/L — ABNORMAL HIGH (ref 6–29)
AST: 37 U/L — ABNORMAL HIGH (ref 10–35)
Albumin: 4.3 g/dL (ref 3.6–5.1)
Alkaline phosphatase (APISO): 84 U/L (ref 37–153)
BUN: 17 mg/dL (ref 7–25)
CO2: 27 mmol/L (ref 20–32)
Calcium: 9.7 mg/dL (ref 8.6–10.4)
Chloride: 104 mmol/L (ref 98–110)
Creat: 0.72 mg/dL (ref 0.50–1.05)
Globulin: 3 g/dL (calc) (ref 1.9–3.7)
Glucose, Bld: 104 mg/dL — ABNORMAL HIGH (ref 65–99)
Potassium: 4.5 mmol/L (ref 3.5–5.3)
Sodium: 139 mmol/L (ref 135–146)
Total Bilirubin: 0.5 mg/dL (ref 0.2–1.2)
Total Protein: 7.3 g/dL (ref 6.1–8.1)

## 2019-05-27 LAB — VITAMIN D 25 HYDROXY (VIT D DEFICIENCY, FRACTURES): Vit D, 25-Hydroxy: 26 ng/mL — ABNORMAL LOW (ref 30–100)

## 2019-05-27 LAB — CBC
HCT: 40 % (ref 35.0–45.0)
Hemoglobin: 13.6 g/dL (ref 11.7–15.5)
MCH: 30.2 pg (ref 27.0–33.0)
MCHC: 34 g/dL (ref 32.0–36.0)
MCV: 88.7 fL (ref 80.0–100.0)
MPV: 9.8 fL (ref 7.5–12.5)
Platelets: 287 10*3/uL (ref 140–400)
RBC: 4.51 10*6/uL (ref 3.80–5.10)
RDW: 11.6 % (ref 11.0–15.0)
WBC: 7.3 10*3/uL (ref 3.8–10.8)

## 2019-05-27 LAB — LIPID PANEL
Cholesterol: 204 mg/dL — ABNORMAL HIGH (ref <200)
HDL: 51 mg/dL (ref >=50)
LDL Cholesterol (Calc): 135 mg/dL (calc) — ABNORMAL HIGH
Non-HDL Cholesterol (Calc): 153 mg/dL (calc) — ABNORMAL HIGH (ref <130)
Total CHOL/HDL Ratio: 4 (calc) (ref <5.0)
Triglycerides: 84 mg/dL (ref <150)

## 2019-05-27 LAB — TSH: TSH: 1.52 mIU/L

## 2019-05-27 LAB — HEMOGLOBIN A1C W/OUT EAG: Hgb A1c MFr Bld: 5.6 % of total Hgb (ref <5.7)

## 2019-05-27 LAB — TEST AUTHORIZATION

## 2019-06-18 ENCOUNTER — Other Ambulatory Visit: Payer: No Typology Code available for payment source

## 2019-06-19 ENCOUNTER — Ambulatory Visit: Payer: Self-pay | Admitting: *Deleted

## 2019-06-19 NOTE — Telephone Encounter (Signed)
Patient is calling because she is not feeling well- stuffy nose, no fever,- son tested yesterday for COVID - results are not back. Patient is taking care of her son wearing a mask. Patient would like to know if she feels worse over the weekend what should she do?    Returned call to patient regarding the above message. She stated she can feel mucous running down the back of her throat, which makes her cough. She denies fever or other symptoms. Advised that she treat her symptoms with over the counter medication. And is they become worst and respiratory distress, she should be seen in the ED. She voiced understanding. Also discussed about getting the covid test. Pt scheduled as requested for the test. Routing to  PCP.  Reason for Disposition . General information question, no triage required and triager able to answer question  Answer Assessment - Initial Assessment Questions 1. REASON FOR CALL or QUESTION: "What is your reason for calling today?" or "How can I best help you?" or "What question do you have that I can help answer?"     What to do if symptoms get worst.  Protocols used: INFORMATION ONLY CALL - NO TRIAGE-A-AH

## 2019-06-22 ENCOUNTER — Ambulatory Visit: Payer: No Typology Code available for payment source | Attending: Internal Medicine

## 2019-06-22 DIAGNOSIS — Z20822 Contact with and (suspected) exposure to covid-19: Secondary | ICD-10-CM | POA: Diagnosis not present

## 2019-06-22 NOTE — Telephone Encounter (Signed)
Message was left on machine that she is dong the right thing by wearing a mask. Also need to keep area clean and hands washed. If feel any worse schedule an appt to for a tele- med visit or got to the ER

## 2019-06-23 LAB — NOVEL CORONAVIRUS, NAA: SARS-CoV-2, NAA: NOT DETECTED

## 2019-07-03 ENCOUNTER — Other Ambulatory Visit: Payer: Self-pay

## 2019-07-03 ENCOUNTER — Encounter: Payer: Self-pay | Admitting: Obstetrics & Gynecology

## 2019-07-03 ENCOUNTER — Ambulatory Visit: Payer: BC Managed Care – PPO | Admitting: Obstetrics & Gynecology

## 2019-07-03 VITALS — BP 122/80

## 2019-07-03 DIAGNOSIS — R350 Frequency of micturition: Secondary | ICD-10-CM | POA: Diagnosis not present

## 2019-07-03 DIAGNOSIS — R102 Pelvic and perineal pain: Secondary | ICD-10-CM

## 2019-07-03 DIAGNOSIS — N898 Other specified noninflammatory disorders of vagina: Secondary | ICD-10-CM | POA: Diagnosis not present

## 2019-07-03 LAB — WET PREP FOR TRICH, YEAST, CLUE

## 2019-07-03 MED ORDER — SULFAMETHOXAZOLE-TRIMETHOPRIM 800-160 MG PO TABS: 1.0000 | ORAL_TABLET | Freq: Two times a day (BID) | ORAL | 0 refills | Status: AC

## 2019-07-03 NOTE — Progress Notes (Signed)
    Cheryl Booth [birth year] [id number]        54 y.o.  G2P1011 Married  CL:[id number] presenting for vaginal d/c with odor with urinary frequency  FMB:[id number] post total hysterectomy. History of anterior and posterior repair. C/O urinary frequency x yesterday with intermittent crampy pelvic pain.  No blood in urine.  Vaginal or urine odor.  No fever. Bowel movements normal.    OB History  Gravida Para Term Preterm AB Living  2 1 1   1 1   SAB TAB Ectopic Multiple Live Births  1       1    # Outcome Date GA Lbr Len/2nd Weight Sex Delivery Anes PTL Lv  2 SAB           1 Term     M Vag-Spont  N LIV    Past medical history,surgical history, problem list, medications, allergies, family history and social history were all reviewed and documented in the EPIC chart.   Directed ROS with pertinent positives and negatives documented in the history of present illness/assessment and plan.  Exam:  Vitals:   07/03/19 1530  BP: 122/80   General appearance:  Normal  Abdomen: Normal  CVAT Negative bilaterally  Gynecologic exam: Vulva normal.  Speculum:  Vaginal vault normal.  Mild secretions.  Wet prep done.  Bimanual exam:  No pelvic mass, NT.  U/A:  Yellow clear, Protein Neg, Nitrite Neg, WBC 0-5, RBC 3-10, Bacteria Few.  Urine Culture pending. Wet prep: Neg  Assessment/Plan:  54 y.o. G2P1011   1. Urinary frequency Urinary frequency with odor as well as pelvic discomfort.  Urine analysis mildly perturbed.  Decision to treat with Bactrim DS 1 tablet twice a day for 3 days.  Usage reviewed and prescription sent to pharmacy.  Pending urine culture. - Urinalysis,Complete w/RFL Culture  2. Vaginal odor Odor was probably from urine.  Wet prep negative.  Patient reassured.  3. Vaginal discharge Negative wet prep.  Patient reassured. - WET PREP FOR TRICH, YEAST, CLUE  4. Pelvic pain in female Probably associated with acute cystitis.  Possible intestinal cramps as  well.  Other orders - sulfamethoxazole-trimethoprim (BACTRIM DS) 800-160 MG tablet; Take 1 tablet by mouth 2 (two) times daily for 3 days.  54 MD, 3:35 PM 07/03/2019

## 2019-07-03 NOTE — Patient Instructions (Signed)
1. Urinary frequency Urinary frequency with odor as well as pelvic discomfort.  Urine analysis mildly perturbed.  Decision to treat with Bactrim DS 1 tablet twice a day for 3 days.  Usage reviewed and prescription sent to pharmacy.  Pending urine culture. - Urinalysis,Complete w/RFL Culture  2. Vaginal odor Odor was probably from urine.  Wet prep negative.  Patient reassured.  3. Vaginal discharge Negative wet prep.  Patient reassured. - WET PREP FOR TRICH, YEAST, CLUE  4. Pelvic pain in female Probably associated with acute cystitis.  Possible intestinal cramps as well.  Other orders - sulfamethoxazole-trimethoprim (BACTRIM DS) 800-160 MG tablet; Take 1 tablet by mouth 2 (two) times daily for 3 days.  Cheryl Booth, it was a pleasure seeing you today!  I will inform you of your urine culture results as soon as they are available.

## 2019-07-05 LAB — URINALYSIS, COMPLETE W/RFL CULTURE
Bilirubin Urine: NEGATIVE
Glucose, UA: NEGATIVE
Hyaline Cast: NONE SEEN /LPF
Ketones, ur: NEGATIVE
Leukocyte Esterase: NEGATIVE
Nitrites, Initial: NEGATIVE
Protein, ur: NEGATIVE
Specific Gravity, Urine: 1.02 (ref 1.001–1.03)
pH: 5 (ref 5.0–8.0)

## 2019-07-05 LAB — CULTURE INDICATED

## 2019-07-05 LAB — URINE CULTURE
MICRO NUMBER:: 10167646
Result:: NO GROWTH
SPECIMEN QUALITY:: ADEQUATE

## 2019-11-11 DIAGNOSIS — M79672 Pain in left foot: Secondary | ICD-10-CM | POA: Diagnosis not present

## 2019-11-11 DIAGNOSIS — M7732 Calcaneal spur, left foot: Secondary | ICD-10-CM | POA: Diagnosis not present

## 2019-11-11 DIAGNOSIS — M7662 Achilles tendinitis, left leg: Secondary | ICD-10-CM | POA: Diagnosis not present

## 2020-02-02 DIAGNOSIS — H9203 Otalgia, bilateral: Secondary | ICD-10-CM | POA: Diagnosis not present

## 2020-02-02 DIAGNOSIS — H9201 Otalgia, right ear: Secondary | ICD-10-CM | POA: Diagnosis not present

## 2020-02-05 ENCOUNTER — Ambulatory Visit (INDEPENDENT_AMBULATORY_CARE_PROVIDER_SITE_OTHER): Payer: No Typology Code available for payment source | Admitting: Otolaryngology

## 2020-03-31 DIAGNOSIS — Z1211 Encounter for screening for malignant neoplasm of colon: Secondary | ICD-10-CM | POA: Diagnosis not present

## 2020-03-31 DIAGNOSIS — K5730 Diverticulosis of large intestine without perforation or abscess without bleeding: Secondary | ICD-10-CM | POA: Diagnosis not present

## 2020-03-31 DIAGNOSIS — K449 Diaphragmatic hernia without obstruction or gangrene: Secondary | ICD-10-CM | POA: Diagnosis not present

## 2020-03-31 DIAGNOSIS — K760 Fatty (change of) liver, not elsewhere classified: Secondary | ICD-10-CM | POA: Diagnosis not present

## 2020-04-19 DIAGNOSIS — H9201 Otalgia, right ear: Secondary | ICD-10-CM | POA: Diagnosis not present

## 2020-05-05 ENCOUNTER — Encounter: Payer: Self-pay | Admitting: Obstetrics & Gynecology

## 2020-05-05 DIAGNOSIS — Z1231 Encounter for screening mammogram for malignant neoplasm of breast: Secondary | ICD-10-CM | POA: Diagnosis not present

## 2020-05-13 DIAGNOSIS — K5730 Diverticulosis of large intestine without perforation or abscess without bleeding: Secondary | ICD-10-CM | POA: Diagnosis not present

## 2020-05-13 DIAGNOSIS — Z1211 Encounter for screening for malignant neoplasm of colon: Secondary | ICD-10-CM | POA: Diagnosis not present

## 2020-05-20 ENCOUNTER — Other Ambulatory Visit: Payer: Self-pay

## 2020-05-20 ENCOUNTER — Ambulatory Visit (INDEPENDENT_AMBULATORY_CARE_PROVIDER_SITE_OTHER): Payer: BC Managed Care – PPO | Admitting: Obstetrics & Gynecology

## 2020-05-20 ENCOUNTER — Encounter: Payer: Self-pay | Admitting: Obstetrics & Gynecology

## 2020-05-20 VITALS — BP 128/78 | Ht 59.5 in | Wt 175.0 lb

## 2020-05-20 DIAGNOSIS — Z6834 Body mass index (BMI) 34.0-34.9, adult: Secondary | ICD-10-CM

## 2020-05-20 DIAGNOSIS — E6609 Other obesity due to excess calories: Secondary | ICD-10-CM

## 2020-05-20 DIAGNOSIS — Z90710 Acquired absence of both cervix and uterus: Secondary | ICD-10-CM

## 2020-05-20 DIAGNOSIS — Z01419 Encounter for gynecological examination (general) (routine) without abnormal findings: Secondary | ICD-10-CM

## 2020-05-20 DIAGNOSIS — Z780 Asymptomatic menopausal state: Secondary | ICD-10-CM | POA: Diagnosis not present

## 2020-05-20 NOTE — Progress Notes (Signed)
Cheryl Booth 19-Aug-1965 [id number]   History:    55 y.o. G2P1A1L1 Married  [id number] presenting for annual gyn exam   KWI:OXBDZH post total hysterectomy. History of anterior and posterior repair. No menopausal syndrome. No pelvic pain.  Pap neg 05/2019. Abstinent. Breasts normal.  Will obtain report of Mammo 04/2020. Urine normal. No stress urinary incontinence. Bowel movements normal. Body mass index 34.75. Physically active at work but no fitness activities. F/U here for Fasting Health labs. Colono 05/13/2020.  Past medical history,surgical history, family history and social history were all reviewed and documented in the EPIC chart.  Gynecologic History Patient's last menstrual period was 04/22/2005.  Obstetric History OB History  Gravida Para Term Preterm AB Living  _0 SAB IAB Ectopic Multiple Live Births  1       1    # Outcome Date GA Lbr Len/2nd Weight Sex Delivery Anes PTL Lv  2 SAB           1 Term     M Vag-Spont  N LIV     ROS: A ROS was performed and pertinent positives and negatives are included in the history.  GENERAL: No fevers or chills. HEENT: No change in vision, no earache, sore throat or sinus congestion. NECK: No pain or stiffness. CARDIOVASCULAR: No chest pain or pressure. No palpitations. PULMONARY: No shortness of breath, cough or wheeze. GASTROINTESTINAL: No abdominal pain, nausea, vomiting or diarrhea, melena or bright red blood per rectum. GENITOURINARY: No urinary frequency, urgency, hesitancy or dysuria. MUSCULOSKELETAL: No joint or muscle pain, no back pain, no recent trauma. DERMATOLOGIC: No rash, no itching, no lesions. ENDOCRINE: No polyuria, polydipsia, no heat or cold intolerance. No recent change in weight. HEMATOLOGICAL: No anemia or easy bruising or bleeding. NEUROLOGIC: No headache, seizures, numbness, tingling or weakness. PSYCHIATRIC: No depression, no loss of interest in normal activity or change in sleep  pattern.     Exam:   BP 128/78    Ht 4' 11.5" (1.511 m)    Wt 175 lb (79.4 kg)    LMP 04/22/2005    BMI 34.75 kg/m   Body mass index is 34.75 kg/m.  General appearance : Well developed well nourished female. No acute distress HEENT: Eyes: no retinal hemorrhage or exudates,  Neck supple, trachea midline, no carotid bruits, no thyroidmegaly Lungs: Clear to auscultation, no rhonchi or wheezes, or rib retractions  Heart: Regular rate and rhythm, no murmurs or gallops Breast:Examined in sitting and supine position were symmetrical in appearance, no palpable masses or tenderness,  no skin retraction, no nipple inversion, no nipple discharge, no skin discoloration, no axillary or supraclavicular lymphadenopathy Abdomen: no palpable masses or tenderness, no rebound or guarding Extremities: no edema or skin discoloration or tenderness  Pelvic: Vulva: Normal             Vagina: No gross lesions or discharge  Cervix/Uterus absent  Adnexa  Without masses or tenderness  Anus: Normal   Assessment/Plan:  55 y.o. female for annual exam   1. Well female exam with routine gynecological exam Gynecologic exam status post total hysterectomy in menopause.  No indication for a Pap test this year.  Breast exam normal.  Needs to schedule a screening mammogram now.  Colonoscopy in 2022.  We will follow-up here for fasting health labs. - CBC; Future - Comp Met (CMET); Future - Lipid Profile; Future - TSH; Future - Vitamin D 1,25 dihydroxy; Future  2. S/P  total hysterectomy  3. Postmenopause Well on no HRT.  4. Class 1 obesity due to excess calories without serious comorbidity with body mass index (BMI) of 34.0 to 34.9 in adult Recommend a low calorie/carb diet.  Aerobic activities 5 times a week and light weightlifting every 2 days.  Princess Bruins MD, 4:06 PM 05/20/2020

## 2020-05-23 ENCOUNTER — Other Ambulatory Visit: Payer: BC Managed Care – PPO

## 2020-05-24 ENCOUNTER — Encounter: Payer: Self-pay | Admitting: Obstetrics & Gynecology

## 2020-06-22 ENCOUNTER — Other Ambulatory Visit: Payer: Self-pay

## 2020-06-22 ENCOUNTER — Other Ambulatory Visit (INDEPENDENT_AMBULATORY_CARE_PROVIDER_SITE_OTHER): Payer: BC Managed Care – PPO

## 2020-06-22 DIAGNOSIS — Z01419 Encounter for gynecological examination (general) (routine) without abnormal findings: Secondary | ICD-10-CM | POA: Diagnosis not present

## 2020-06-22 DIAGNOSIS — Z1329 Encounter for screening for other suspected endocrine disorder: Secondary | ICD-10-CM | POA: Diagnosis not present

## 2020-06-22 DIAGNOSIS — Z1321 Encounter for screening for nutritional disorder: Secondary | ICD-10-CM | POA: Diagnosis not present

## 2020-06-22 DIAGNOSIS — Z13220 Encounter for screening for lipoid disorders: Secondary | ICD-10-CM | POA: Diagnosis not present

## 2020-06-24 ENCOUNTER — Other Ambulatory Visit: Payer: Self-pay | Admitting: *Deleted

## 2020-06-24 DIAGNOSIS — E7800 Pure hypercholesterolemia, unspecified: Secondary | ICD-10-CM

## 2020-06-24 DIAGNOSIS — R7401 Elevation of levels of liver transaminase levels: Secondary | ICD-10-CM

## 2020-06-25 LAB — COMPREHENSIVE METABOLIC PANEL
AG Ratio: 1.5 (calc) (ref 1.0–2.5)
ALT: 44 U/L — ABNORMAL HIGH (ref 6–29)
AST: 30 U/L (ref 10–35)
Albumin: 4.6 g/dL (ref 3.6–5.1)
Alkaline phosphatase (APISO): 79 U/L (ref 37–153)
BUN: 13 mg/dL (ref 7–25)
CO2: 28 mmol/L (ref 20–32)
Calcium: 9.8 mg/dL (ref 8.6–10.4)
Chloride: 102 mmol/L (ref 98–110)
Creat: 0.73 mg/dL (ref 0.50–1.05)
Globulin: 3.1 g/dL (calc) (ref 1.9–3.7)
Glucose, Bld: 96 mg/dL (ref 65–99)
Potassium: 4.4 mmol/L (ref 3.5–5.3)
Sodium: 138 mmol/L (ref 135–146)
Total Bilirubin: 0.8 mg/dL (ref 0.2–1.2)
Total Protein: 7.7 g/dL (ref 6.1–8.1)

## 2020-06-25 LAB — LIPID PANEL
Cholesterol: 183 mg/dL (ref <200)
HDL: 53 mg/dL (ref >=50)
LDL Cholesterol (Calc): 110 mg/dL (calc) — ABNORMAL HIGH
Non-HDL Cholesterol (Calc): 130 mg/dL (calc) — ABNORMAL HIGH (ref <130)
Total CHOL/HDL Ratio: 3.5 (calc) (ref <5.0)
Triglycerides: 102 mg/dL (ref <150)

## 2020-06-25 LAB — CBC
HCT: 41.1 % (ref 35.0–45.0)
Hemoglobin: 13.9 g/dL (ref 11.7–15.5)
MCH: 29.8 pg (ref 27.0–33.0)
MCHC: 33.8 g/dL (ref 32.0–36.0)
MCV: 88 fL (ref 80.0–100.0)
MPV: 10.3 fL (ref 7.5–12.5)
Platelets: 276 10*3/uL (ref 140–400)
RBC: 4.67 10*6/uL (ref 3.80–5.10)
RDW: 12 % (ref 11.0–15.0)
WBC: 7.6 10*3/uL (ref 3.8–10.8)

## 2020-06-25 LAB — VITAMIN D 1,25 DIHYDROXY
Vitamin D 1, 25 (OH)2 Total: 61 pg/mL (ref 18–72)
Vitamin D2 1, 25 (OH)2: <8 pg/mL
Vitamin D3 1, 25 (OH)2: 61 pg/mL

## 2020-06-25 LAB — TSH: TSH: 1.15 mIU/L (ref 0.40–4.50)

## 2020-10-25 ENCOUNTER — Encounter: Payer: Self-pay | Admitting: Internal Medicine

## 2020-10-25 ENCOUNTER — Ambulatory Visit: Payer: BC Managed Care – PPO | Admitting: Internal Medicine

## 2020-10-25 ENCOUNTER — Other Ambulatory Visit: Payer: Self-pay

## 2020-10-25 VITALS — BP 122/84 | HR 67 | Temp 98.4°F | Ht 59.0 in | Wt 155.6 lb

## 2020-10-25 DIAGNOSIS — Z0000 Encounter for general adult medical examination without abnormal findings: Secondary | ICD-10-CM

## 2020-10-25 DIAGNOSIS — K760 Fatty (change of) liver, not elsewhere classified: Secondary | ICD-10-CM | POA: Diagnosis not present

## 2020-10-25 NOTE — Patient Instructions (Signed)
We do not need any labs today.   

## 2020-10-25 NOTE — Assessment & Plan Note (Signed)
Flu shot yearly. Covid-19 2 shots declines further. Shingrix declines. Tetanus declines. Colonoscopy up to date. Mammogram up to date, pap smear up to date. Counseled about sun safety and mole surveillance. Counseled about the dangers of distracted driving. Given 10 year screening recommendations.

## 2020-10-25 NOTE — Progress Notes (Signed)
   Subjective:   Patient ID: Cheryl Booth, female    DOB: January 08, 1966, 55 y.o.   MRN: [id number]  HPI The patient is a 55 YO female coming in new for physical.   PMH, FMH, social history reviewed and updated.  Review of Systems  Constitutional: Negative.   HENT: Negative.    Eyes: Negative.   Respiratory:  Negative for cough, chest tightness and shortness of breath.   Cardiovascular:  Negative for chest pain, palpitations and leg swelling.  Gastrointestinal:  Negative for abdominal distention, abdominal pain, constipation, diarrhea, nausea and vomiting.  Musculoskeletal: Negative.   Skin: Negative.   Neurological: Negative.   Psychiatric/Behavioral: Negative.     Objective:  Physical Exam Constitutional:      Appearance: She is well-developed.  HENT:     Head: Normocephalic and atraumatic.  Cardiovascular:     Rate and Rhythm: Normal rate and regular rhythm.  Pulmonary:     Effort: Pulmonary effort is normal. No respiratory distress.     Breath sounds: Normal breath sounds. No wheezing or rales.  Abdominal:     General: Bowel sounds are normal. There is no distension.     Palpations: Abdomen is soft.     Tenderness: There is no abdominal tenderness. There is no rebound.  Musculoskeletal:     Cervical back: Normal range of motion.  Skin:    General: Skin is warm and dry.  Neurological:     Mental Status: She is alert and oriented to person, place, and time.     Coordination: Coordination normal.    Vitals:   10/25/20 0945  BP: 122/84  Pulse: 67  Temp: 98.4 F (36.9 C)  TempSrc: Oral  SpO2: 97%  Weight: 155 lb 9.6 oz (70.6 kg)  Height: 4\' 11"  (1.499 m)    This visit occurred during the SARS-CoV-2 public health emergency.  Safety protocols were in place, including screening questions prior to the visit, additional usage of staff PPE, and extensive cleaning of exam room while observing appropriate contact time as indicated for disinfecting solutions.   Assessment  & Plan:

## 2020-10-25 NOTE — Assessment & Plan Note (Signed)
Noted on prior MRI and LFTs consistent with this. She is working on diet and exercise.

## 2021-04-14 ENCOUNTER — Telehealth: Payer: Self-pay | Admitting: Internal Medicine

## 2021-04-14 NOTE — Telephone Encounter (Signed)
Fine with me

## 2021-04-14 NOTE — Telephone Encounter (Signed)
Called and wanted to see about becoming establish w/ Dr.Paz she stated that her mother was a pt of his for many many years until she passed away.  Pt. Mother:  Janiece Scovill 01/18/1938

## 2021-04-14 NOTE — Telephone Encounter (Signed)
Okay to schedule NP appt w/ Dr. Drue Novel. Thank you.

## 2021-04-14 NOTE — Telephone Encounter (Signed)
Okay with me 

## 2021-05-10 DIAGNOSIS — Z1231 Encounter for screening mammogram for malignant neoplasm of breast: Secondary | ICD-10-CM | POA: Diagnosis not present

## 2021-05-16 ENCOUNTER — Encounter: Payer: Self-pay | Admitting: Obstetrics & Gynecology

## 2021-05-23 ENCOUNTER — Ambulatory Visit: Payer: BC Managed Care – PPO | Admitting: Obstetrics & Gynecology

## 2021-05-26 ENCOUNTER — Encounter: Payer: Self-pay | Admitting: Internal Medicine

## 2021-05-26 ENCOUNTER — Ambulatory Visit: Payer: BC Managed Care – PPO | Admitting: Internal Medicine

## 2021-05-26 VITALS — BP 122/84 | HR 77 | Temp 98.1°F | Resp 16 | Ht 59.0 in | Wt 167.0 lb

## 2021-05-26 DIAGNOSIS — E669 Obesity, unspecified: Secondary | ICD-10-CM | POA: Diagnosis not present

## 2021-05-26 DIAGNOSIS — Z7185 Encounter for immunization safety counseling: Secondary | ICD-10-CM | POA: Diagnosis not present

## 2021-05-26 DIAGNOSIS — R739 Hyperglycemia, unspecified: Secondary | ICD-10-CM

## 2021-05-26 DIAGNOSIS — Z09 Encounter for follow-up examination after completed treatment for conditions other than malignant neoplasm: Secondary | ICD-10-CM | POA: Insufficient documentation

## 2021-05-26 NOTE — Patient Instructions (Signed)
° ° °  GO TO THE FRONT DESK, PLEASE SCHEDULE YOUR APPOINTMENTS Come back for  a physical exam by June 2023

## 2021-05-26 NOTE — Progress Notes (Signed)
Subjective:    Patient ID: Cheryl Booth, female    DOB: Aug 02, 1965, 56 y.o.   MRN: [id number]  DOS:  05/26/2021 Type of visit - description: transferring to this office  Here to get established. Previous notes and labs reviewed. In general feeling well. She knows she is a slightly overweight.  Review of Systems See above   Past Medical History:  Diagnosis Date   Hiatal hernia    NSVD (normal spontaneous vaginal delivery)     Past Surgical History:  Procedure Laterality Date   ABDOMINAL HYSTERECTOMY     TVH/ A&P REPAIR   DILATION AND CURETTAGE OF UTERUS     x2   Social History   Socioeconomic History   Marital status: Married    Spouse name: Not on file   Number of children: 1   Years of education: Not on file   Highest education level: Not on file  Occupational History   Occupation: owns [employer]  Tobacco Use   Smoking status: Never   Smokeless tobacco: Never  Vaping Use   Vaping Use: Never used  Substance and Sexual Activity   Alcohol use: No    Alcohol/week: 0.0 standard drinks    Comment: rare   Drug use: No   Sexual activity: Not Currently    Partners: Male    Birth control/protection: Surgical    Comment: 1st intercourse- 19, partners- 5, married- 21 yrs   Other Topics Concern   Not on file  Social History Narrative   Originally from [place].   Household: pt and husband   Lost mother Kenitha Glendinning (my patient) to pancreatic cancer July 2022   Social Determinants of Health   Financial Resource Strain: Not on file  Food Insecurity: Not on file  Transportation Needs: Not on file  Physical Activity: Not on file  Stress: Not on file  Social Connections: Not on file  Intimate Partner Violence: Not on file    No current outpatient medications     Objective:   Physical Exam BP 122/84 (BP Location: Left Arm, Patient Position: Sitting, Cuff Size: Small)    Pulse 77    Temp 98.1 F (36.7 C) (Oral)    Resp 16    Ht 4\' 11"  (1.499  m)    Wt 167 lb (75.8 kg)    LMP 04/22/2005    SpO2 98%    BMI 33.73 kg/m  General:   Well developed, NAD, BMI noted. HEENT:  Normocephalic . Face symmetric, atraumatic Lungs:  CTA B Normal respiratory effort, no intercostal retractions, no accessory muscle use. Heart: RRR,  no murmur.  Lower extremities: no pretibial edema bilaterally  Skin: Not pale. Not jaundice Neurologic:  alert & oriented X3.  Speech normal, gait appropriate for age and unassisted Psych--  Cognition and judgment appear intact.  Cooperative with normal attention span and concentration.  Behavior appropriate. No anxious or depressed appearing.      Assessment    Assessment (transfer to me 05/26/2021) A1C 5.8 Obesity Hysterectomy (TVH) LFTs elecation  PLAN Hyperglycemia: A1c was slightly elevated at some point, recheck on RTC. Obesity: BMI 33, diet and exercise discussed Preventive care: Benefits of flu and COVID-vaccine discussed, she is very hesitant.  Female care per gynecology.  RTC 10/2021 for CPX.  Next     This visit occurred during the SARS-CoV-2 public health emergency.  Safety protocols were in place, including screening questions prior to the visit, additional usage of staff PPE, and extensive cleaning of  exam room while observing appropriate contact time as indicated for disinfecting solutions.

## 2021-05-26 NOTE — Assessment & Plan Note (Signed)
Hyperglycemia: A1c was slightly elevated at some point, recheck on RTC. Obesity: BMI 33, diet and exercise discussed Preventive care: Benefits of flu and COVID-vaccine discussed, she is very hesitant.  Female care per gynecology.  RTC 10/2021 for CPX.  Next

## 2021-06-28 ENCOUNTER — Encounter: Payer: Self-pay | Admitting: Obstetrics & Gynecology

## 2021-06-28 ENCOUNTER — Ambulatory Visit (INDEPENDENT_AMBULATORY_CARE_PROVIDER_SITE_OTHER): Payer: BC Managed Care – PPO | Admitting: Obstetrics & Gynecology

## 2021-06-28 ENCOUNTER — Other Ambulatory Visit: Payer: Self-pay

## 2021-06-28 VITALS — BP 120/78 | HR 64 | Resp 16 | Ht 59.25 in | Wt 166.0 lb

## 2021-06-28 DIAGNOSIS — Z800 Family history of malignant neoplasm of digestive organs: Secondary | ICD-10-CM | POA: Diagnosis not present

## 2021-06-28 DIAGNOSIS — Z780 Asymptomatic menopausal state: Secondary | ICD-10-CM | POA: Diagnosis not present

## 2021-06-28 DIAGNOSIS — Z6833 Body mass index (BMI) 33.0-33.9, adult: Secondary | ICD-10-CM

## 2021-06-28 DIAGNOSIS — Z01419 Encounter for gynecological examination (general) (routine) without abnormal findings: Secondary | ICD-10-CM | POA: Diagnosis not present

## 2021-06-28 DIAGNOSIS — Z90710 Acquired absence of both cervix and uterus: Secondary | ICD-10-CM | POA: Diagnosis not present

## 2021-06-28 DIAGNOSIS — E6609 Other obesity due to excess calories: Secondary | ICD-10-CM

## 2021-06-28 NOTE — Progress Notes (Signed)
Cheryl Booth [birth year] [id number]   History:    57 y.o. G2P1A1L1 Married. 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 [place] to [place]  Mother died of pancreatic Ca in 2020-07-03.   RP:  Established patient presenting for annual gyn exam    HPI: Status post total hysterectomy.  History of anterior and posterior repair.  No menopausal syndrome.  No pelvic pain.  Pap neg 05/2019.  Abstinent. Breasts normal. Mammo Neg 04/2021. Will obtain report of Mammo 04/2020.  Urine normal.  No stress urinary incontinence.  Bowel movements normal.  Body mass index 33.25.  Physically active at work but no fitness activities.  F/U here for Fasting Health labs. Colono 05/13/2020.  Past medical history,surgical history, family history and social history were all reviewed and documented in the EPIC chart.  Gynecologic History Patient's last menstrual period was 04/22/2005.  Obstetric History OB History  Gravida Para Term Preterm AB Living  _0 SAB IAB Ectopic Multiple Live Births  1       1    # Outcome Date GA Lbr Len/2nd Weight Sex Delivery Anes PTL Lv  2 SAB           1 Term     M Vag-Spont  N LIV     ROS: A ROS was performed and pertinent positives and negatives are included in the history. GENERAL: No fevers or chills. HEENT: No change in vision, no earache, sore throat or sinus congestion. NECK: No pain or stiffness. CARDIOVASCULAR: No chest pain or pressure. No palpitations. PULMONARY: No shortness of breath, cough or wheeze. GASTROINTESTINAL: No abdominal pain, nausea, vomiting or diarrhea, melena or bright red blood per rectum. GENITOURINARY: No urinary frequency, urgency, hesitancy or dysuria. MUSCULOSKELETAL: No joint or muscle pain, no back pain, no recent trauma. DERMATOLOGIC: No rash, no itching, no lesions. ENDOCRINE: No polyuria, polydipsia, no heat or cold intolerance. No recent change in weight. HEMATOLOGICAL: No anemia or easy bruising or bleeding. NEUROLOGIC: No headache,  seizures, numbness, tingling or weakness. PSYCHIATRIC: No depression, no loss of interest in normal activity or change in sleep pattern.     Exam:   BP 120/78    Pulse 64    Resp 16    Ht 4' 11.25" (1.505 m)    Wt 166 lb (75.3 kg)    LMP 04/22/2005    BMI 33.25 kg/m   Body mass index is 33.25 kg/m.  General appearance : Well developed well nourished female. No acute distress HEENT: Eyes: no retinal hemorrhage or exudates,  Neck supple, trachea midline, no carotid bruits, no thyroidmegaly Lungs: Clear to auscultation, no rhonchi or wheezes, or rib retractions  Heart: Regular rate and rhythm, no murmurs or gallops Breast:Examined in sitting and supine position were symmetrical in appearance, no palpable masses or tenderness,  no skin retraction, no nipple inversion, no nipple discharge, no skin discoloration, no axillary or supraclavicular lymphadenopathy Abdomen: no palpable masses or tenderness, no rebound or guarding Extremities: no edema or skin discoloration or tenderness  Pelvic: Vulva: Normal             Vagina: No gross lesions or discharge  Cervix: No gross lesions or discharge  Uterus  AV, normal size, shape and consistency, non-tender and mobile  Adnexa  Without masses or tenderness  Anus: Normal   Assessment/Plan:  56 y.o. female for annual exam   1. Well female exam with routine gynecological exam Status post total  hysterectomy.  History of anterior and posterior repair.  No menopausal syndrome.  No pelvic pain.  Pap neg 05/2019.  Abstinent. Breasts normal. Mammo Neg 04/2021. Will obtain report of Mammo 04/2020.  Urine normal.  No stress urinary incontinence.  Bowel movements normal.  Body mass index 33.25.  Physically active at work but no fitness activities.  F/U here for Fasting Health labs. Colono 05/13/2020. - CBC; Future - Comp Met (CMET); Future - TSH; Future - Lipid Profile; Future - Vitamin D (25 hydroxy); Future  2. S/P total hysterectomy  3.  Postmenopause Status post total hysterectomy.  History of anterior and posterior repair.  No menopausal syndrome.  No pelvic pain.  Vit D supplement, Ca++ 1.5 g/d total, regular weight bearing physical activities.  4. Family history of pancreatic cancer - Amylase; Future  5. Class 1 obesity due to excess calories with serious comorbidity and body mass index (BMI) of 33.0 to 33.9 in adult  Increase fitness activities. Low calorie/carb diet.  Princess Bruins MD, 4:45 PM 06/28/2021

## 2021-06-29 ENCOUNTER — Other Ambulatory Visit: Payer: BC Managed Care – PPO

## 2021-06-29 DIAGNOSIS — Z01419 Encounter for gynecological examination (general) (routine) without abnormal findings: Secondary | ICD-10-CM | POA: Diagnosis not present

## 2021-06-29 DIAGNOSIS — Z800 Family history of malignant neoplasm of digestive organs: Secondary | ICD-10-CM | POA: Diagnosis not present

## 2021-06-30 LAB — COMPREHENSIVE METABOLIC PANEL
AG Ratio: 1.3 (calc) (ref 1.0–2.5)
ALT: 23 U/L (ref 6–29)
AST: 18 U/L (ref 10–35)
Albumin: 4.4 g/dL (ref 3.6–5.1)
Alkaline phosphatase (APISO): 80 U/L (ref 37–153)
BUN: 15 mg/dL (ref 7–25)
CO2: 27 mmol/L (ref 20–32)
Calcium: 9.7 mg/dL (ref 8.6–10.4)
Chloride: 104 mmol/L (ref 98–110)
Creat: 0.72 mg/dL (ref 0.50–1.03)
Globulin: 3.3 g/dL (calc) (ref 1.9–3.7)
Glucose, Bld: 93 mg/dL (ref 65–99)
Potassium: 4.3 mmol/L (ref 3.5–5.3)
Sodium: 139 mmol/L (ref 135–146)
Total Bilirubin: 0.7 mg/dL (ref 0.2–1.2)
Total Protein: 7.7 g/dL (ref 6.1–8.1)

## 2021-06-30 LAB — CBC
HCT: 41.2 % (ref 35.0–45.0)
Hemoglobin: 13.9 g/dL (ref 11.7–15.5)
MCH: 29.6 pg (ref 27.0–33.0)
MCHC: 33.7 g/dL (ref 32.0–36.0)
MCV: 87.7 fL (ref 80.0–100.0)
MPV: 9.9 fL (ref 7.5–12.5)
Platelets: 288 10*3/uL (ref 140–400)
RBC: 4.7 10*6/uL (ref 3.80–5.10)
RDW: 11.8 % (ref 11.0–15.0)
WBC: 6.8 10*3/uL (ref 3.8–10.8)

## 2021-06-30 LAB — LIPID PANEL
Cholesterol: 227 mg/dL — ABNORMAL HIGH (ref <200)
HDL: 55 mg/dL (ref >=50)
LDL Cholesterol (Calc): 145 mg/dL (calc) — ABNORMAL HIGH
Non-HDL Cholesterol (Calc): 172 mg/dL (calc) — ABNORMAL HIGH (ref <130)
Total CHOL/HDL Ratio: 4.1 (calc) (ref <5.0)
Triglycerides: 145 mg/dL (ref <150)

## 2021-06-30 LAB — VITAMIN D 25 HYDROXY (VIT D DEFICIENCY, FRACTURES): Vit D, 25-Hydroxy: 20 ng/mL — ABNORMAL LOW (ref 30–100)

## 2021-06-30 LAB — TSH: TSH: 1.58 mIU/L (ref 0.40–4.50)

## 2021-06-30 LAB — AMYLASE: Amylase: 58 U/L (ref 21–101)

## 2021-07-17 ENCOUNTER — Other Ambulatory Visit: Payer: Self-pay | Admitting: *Deleted

## 2021-07-17 ENCOUNTER — Telehealth: Payer: Self-pay | Admitting: *Deleted

## 2021-07-17 DIAGNOSIS — E559 Vitamin D deficiency, unspecified: Secondary | ICD-10-CM

## 2021-07-17 MED ORDER — VITAMIN D (ERGOCALCIFEROL) 1.25 MG (50000 UNIT) PO CAPS: 50000.0000 [IU] | ORAL_CAPSULE | ORAL | 0 refills | Status: DC

## 2021-07-17 NOTE — Telephone Encounter (Signed)
Patient called asking if you could call her when you have time. Patient is very concerned about her sister physical health( her sister is your patient as well) patient said the call won't be long. I did explained to patient you are seeing patients . Please advise  ?

## 2021-07-24 NOTE — Telephone Encounter (Signed)
Dr.Lavoie replied "Her sister is my patient, I therefore recommend for her to schedule a visit for her sister with me.  Privacy issues. " ? ? ?Patient informed.  ?

## 2021-10-15 ENCOUNTER — Other Ambulatory Visit: Payer: Self-pay | Admitting: Obstetrics & Gynecology

## 2021-10-24 ENCOUNTER — Encounter: Payer: BC Managed Care – PPO | Admitting: Internal Medicine

## 2021-10-24 ENCOUNTER — Other Ambulatory Visit: Payer: BC Managed Care – PPO

## 2021-10-24 ENCOUNTER — Other Ambulatory Visit: Payer: Self-pay

## 2021-10-24 DIAGNOSIS — E559 Vitamin D deficiency, unspecified: Secondary | ICD-10-CM | POA: Diagnosis not present

## 2021-10-25 LAB — VITAMIN D 25 HYDROXY (VIT D DEFICIENCY, FRACTURES): Vit D, 25-Hydroxy: 55 ng/mL (ref 30–100)

## 2021-11-13 ENCOUNTER — Ambulatory Visit (INDEPENDENT_AMBULATORY_CARE_PROVIDER_SITE_OTHER): Payer: BC Managed Care – PPO | Admitting: Internal Medicine

## 2021-11-13 ENCOUNTER — Encounter: Payer: Self-pay | Admitting: Internal Medicine

## 2021-11-13 VITALS — BP 122/76 | HR 60 | Temp 98.1°F | Resp 16 | Ht 59.25 in | Wt 150.0 lb

## 2021-11-13 DIAGNOSIS — K5730 Diverticulosis of large intestine without perforation or abscess without bleeding: Secondary | ICD-10-CM | POA: Insufficient documentation

## 2021-11-13 DIAGNOSIS — K644 Residual hemorrhoidal skin tags: Secondary | ICD-10-CM | POA: Insufficient documentation

## 2021-11-13 DIAGNOSIS — R739 Hyperglycemia, unspecified: Secondary | ICD-10-CM | POA: Diagnosis not present

## 2021-11-13 DIAGNOSIS — R1310 Dysphagia, unspecified: Secondary | ICD-10-CM | POA: Insufficient documentation

## 2021-11-13 DIAGNOSIS — Z0000 Encounter for general adult medical examination without abnormal findings: Secondary | ICD-10-CM

## 2021-11-13 LAB — LIPID PANEL
Cholesterol: 160 mg/dL (ref 0–200)
HDL: 60.8 mg/dL (ref >39.00)
LDL Cholesterol: 81 mg/dL (ref 0–99)
NonHDL: 98.9
Total CHOL/HDL Ratio: 3
Triglycerides: 92 mg/dL (ref 0.0–149.0)
VLDL: 18.4 mg/dL (ref 0.0–40.0)

## 2021-11-13 LAB — HEMOGLOBIN A1C: Hgb A1c MFr Bld: 5.7 % (ref 4.6–6.5)

## 2021-11-13 NOTE — Progress Notes (Signed)
Subjective:    Patient ID: Cheryl Booth, female    DOB: 04-Sep-1965, 56 y.o.   MRN: [id number]  DOS:  11/13/2021 Type of visit - description: cpx  In general feels well. Has been a stress in the last few months, problems related to her marriage.  Wt Readings from Last 3 Encounters:  11/13/21 150 lb (68 kg)  06/28/21 166 lb (75.3 kg)  05/26/21 167 lb (75.8 kg)    Review of Systems  Other than above, a 14 point review of systems is negative    Past Medical History:  Diagnosis Date   Hiatal hernia    NSVD (normal spontaneous vaginal delivery)     Past Surgical History:  Procedure Laterality Date   ABDOMINAL HYSTERECTOMY     TVH/ A&P REPAIR   DILATION AND CURETTAGE OF UTERUS     x2   Social History   Socioeconomic History   Marital status: Married    Spouse name: Not on file   Number of children: 1   Years of education: Not on file   Highest education level: Not on file  Occupational History   Occupation: works at  [employer]  Tobacco Use   Smoking status: Never   Smokeless tobacco: Never  Vaping Use   Vaping Use: Never used  Substance and Sexual Activity   Alcohol use: No   Drug use: No   Sexual activity: Not Currently    Partners: Male    Birth control/protection: Surgical    Comment: 1st intercourse- 19, partners- 5, married- 21 yrs, hysterectomy  Other Topics Concern   Not on file  Social History Narrative   Originally from [place].   Household: pt and husband   Lost mother Cheryl Booth (my patient) to pancreatic cancer July 2022   Social Determinants of Health   Financial Resource Strain: Not on file  Food Insecurity: Not on file  Transportation Needs: Not on file  Physical Activity: Not on file  Stress: Not on file  Social Connections: Not on file  Intimate Partner Violence: Not on file     Current Outpatient Medications  Medication Instructions   cholecalciferol (VITAMIN D3) 2,000 Units, Oral, Daily       Objective:   Physical  Exam BP 122/76   Pulse 60   Temp 98.1 F (36.7 C) (Oral)   Resp 16   Ht 4' 11.25" (1.505 m)   Wt 150 lb (68 kg)   LMP 04/22/2005   SpO2 97%   BMI 30.04 kg/m  General: Well developed, NAD, BMI noted Neck: No  thyromegaly  HEENT:  Normocephalic . Face symmetric, atraumatic Lungs:  CTA B Normal respiratory effort, no intercostal retractions, no accessory muscle use. Heart: RRR,  no murmur.  Abdomen:  Not distended, soft, non-tender. No rebound or rigidity.   Lower extremities: no pretibial edema bilaterally  Skin: Exposed areas without rash. Not pale. Not jaundice Neurologic:  alert & oriented X3.  Speech normal, gait appropriate for age and unassisted Strength symmetric and appropriate for age.  Psych: Cognition and judgment appear intact.  Cooperative with normal attention span and concentration.  Behavior appropriate. No anxious or depressed appearing.     Assessment     Assessment (transfer to me 05/26/2021) A1C 5.8 Obesity Hysterectomy (TVH) LFTs elecation  PLAN Here for CPX Obesity: Doing great in the last few months, + weight loss.  She is also exercising regularly, praised. Stress: Related to her marriage, she is concerned about it, listening therapy  provided, recommend to see a Pharmacist, hospital, information provided.  Knows to call me if she get much worse and/or she needs medication.  RTC 1 year

## 2021-11-13 NOTE — Assessment & Plan Note (Signed)
Here for CPX Obesity: Doing great in the last few months, + weight loss.  She is also exercising regularly, praised. Stress: Related to her marriage, she is concerned about it, listening therapy provided, recommend to see a Pharmacist, hospital, information provided.  Knows to call me if she get much worse and/or she needs medication.  RTC 1 year

## 2021-11-13 NOTE — Patient Instructions (Signed)
   GO TO THE LAB : Get the blood work     GO TO THE FRONT DESK, PLEASE SCHEDULE YOUR APPOINTMENTS Come back for   a physical exam in 1 year   "Living will", "Health Care Power of attorney": Advanced care planning  (If you already have a living will or healthcare power of attorney, please bring the copy to be scanned in your chart.)  Advance care planning is a process that supports adults in  understanding and sharing their preferences regarding future medical care.   The patient's preferences are recorded in documents called Advance Directives.    Advanced directives are completed (and can be modified at any time) while the patient is in full mental capacity.   The documentation should be available at all times to the patient, the family and the healthcare providers.  Bring in a copy to be scanned in your chart is an excellent idea and is recommended   This legal documents direct treatment decision making and/or appoint a surrogate to make the decision if the patient is not capable to do so.    Advance directives can be documented in many types of formats,  documents have names such as:  Lliving will  Durable power of attorney for healthcare (healthcare proxy or healthcare power of attorney)  Combined directives  Physician orders for life-sustaining treatment    More information at:  [email or web address]  

## 2021-11-13 NOTE — Assessment & Plan Note (Signed)
-   Tdap: Benefits discussed.  Declined  - COVID-vaccine: Booster recommended - Shingrix discussed, declined at -Female care: Per [facility] Pap smear 05-2019.  Mammogram 05/16/2021 -[facility]: Colonoscopy 05/13/2020, next 10 years per report. - Labs reviewed, will recheck FLP and A1c - POA discussed -Lifestyle is very good, + weight loss.

## 2021-11-16 ENCOUNTER — Encounter: Payer: Self-pay | Admitting: Internal Medicine

## 2021-11-16 ENCOUNTER — Ambulatory Visit: Payer: BC Managed Care – PPO | Admitting: Internal Medicine

## 2021-11-16 VITALS — BP 126/76 | HR 77 | Temp 98.2°F | Resp 16 | Ht 59.25 in | Wt 150.0 lb

## 2021-11-16 DIAGNOSIS — S80862A Insect bite (nonvenomous), left lower leg, initial encounter: Secondary | ICD-10-CM | POA: Diagnosis not present

## 2021-11-16 DIAGNOSIS — W57XXXA Bitten or stung by nonvenomous insect and other nonvenomous arthropods, initial encounter: Secondary | ICD-10-CM

## 2021-11-16 DIAGNOSIS — F419 Anxiety disorder, unspecified: Secondary | ICD-10-CM | POA: Diagnosis not present

## 2021-11-16 MED ORDER — ESCITALOPRAM OXALATE 10 MG PO TABS: 10.0000 mg | ORAL_TABLET | Freq: Every day | ORAL | 3 refills | Status: DC

## 2021-11-16 MED ORDER — DOXYCYCLINE HYCLATE 100 MG PO TABS: 100.0000 mg | ORAL_TABLET | Freq: Two times a day (BID) | ORAL | 0 refills | Status: DC

## 2021-11-16 NOTE — Progress Notes (Signed)
   Subjective:    Patient ID: Cheryl Booth, female    DOB: November 28, 1965, 56 y.o.   MRN: [id number]  DOS:  11/16/2021 Type of visit - description: Acute  She went hiking 5 days ago, this morning she found a tick at the left leg.  The area is slightly red. She denies fever chills, no arthralgias or myalgias.  Also, continue with the stress associated with her marriage. No suicidal ideas. Think she needs medication for above  Review of Systems See above   Past Medical History:  Diagnosis Date   Hiatal hernia    NSVD (normal spontaneous vaginal delivery)     Past Surgical History:  Procedure Laterality Date   ABDOMINAL HYSTERECTOMY     TVH/ A&P REPAIR   DILATION AND CURETTAGE OF UTERUS     x2    Current Outpatient Medications  Medication Instructions   cholecalciferol (VITAMIN D3) 2,000 Units, Oral, Daily       Objective:   Physical Exam Skin:        BP 126/76   Pulse 77   Temp 98.2 F (36.8 C) (Oral)   Resp 16   Ht 4' 11.25" (1.505 m)   Wt 150 lb (68 kg)   LMP 04/22/2005   SpO2 97%   BMI 30.04 kg/m  General:   Well developed, NAD, BMI noted. HEENT:  Normocephalic . Face symmetric, atraumatic Lower extremities: no pretibial edema bilaterally  Skin: See graphic Neurologic:  alert & oriented X3.  Speech normal, gait appropriate for age and unassisted Psych--  Cognition and judgment appear intact.  Cooperative with normal attention span and concentration.  Behavior appropriate. No anxious or depressed appearing.      Assessment     Assessment (transfer to me 05/26/2021) A1C 5.8 Obesity Hysterectomy (TVH) LFTs elevation  PLAN Tick bite: As described above, the rash is circular, I think is appropriate to start empiric antibiotics, doxycycline x1 week.  She is concerned about side effects, extensive discussion about it. Anxiety: See last visit, a lot of stress related to her marriage, listening therapy again provided, at this point she is more open  to start a medication.  I recommend Lexapro, extensive discussion about side effects including suicidality. Also again recommend to see a counselor. RTC 6 weeks

## 2021-11-16 NOTE — Assessment & Plan Note (Signed)
Tick bite: As described above, the rash is circular, I think is appropriate to start empiric antibiotics, doxycycline x1 week.  She is concerned about side effects, extensive discussion about it. Anxiety: See last visit, a lot of stress related to her marriage, listening therapy again provided, at this point she is more open to start a medication.  I recommend Lexapro, extensive discussion about side effects including suicidality. Also again recommend to see a counselor. RTC 6 weeks

## 2021-11-16 NOTE — Patient Instructions (Addendum)
Take the antibiotic for 1 week  Call if the rash is no better  Ok to use hydrocortisone 1% OTC   Call if fever chills aches or pains    Also, start Lexapro.  Come back in 6 weeks for checkup

## 2021-12-01 DIAGNOSIS — F4322 Adjustment disorder with anxiety: Secondary | ICD-10-CM | POA: Diagnosis not present

## 2021-12-08 DIAGNOSIS — F4322 Adjustment disorder with anxiety: Secondary | ICD-10-CM | POA: Diagnosis not present

## 2022-01-05 DIAGNOSIS — F4322 Adjustment disorder with anxiety: Secondary | ICD-10-CM | POA: Diagnosis not present

## 2022-01-19 ENCOUNTER — Encounter: Payer: Self-pay | Admitting: Internal Medicine

## 2022-01-19 ENCOUNTER — Ambulatory Visit: Payer: BC Managed Care – PPO | Admitting: Internal Medicine

## 2022-01-19 VITALS — BP 122/64 | HR 62 | Temp 97.9°F | Resp 16 | Ht 59.25 in | Wt 148.2 lb

## 2022-01-19 DIAGNOSIS — F419 Anxiety disorder, unspecified: Secondary | ICD-10-CM

## 2022-01-19 NOTE — Progress Notes (Signed)
   Subjective:    Patient ID: Cheryl Booth, female    DOB: Apr 22, 1966, 56 y.o.   MRN: [id number]  DOS:  01/19/2022 Type of visit - description: Follow-up  See LOV. Was seen with anxiety.  Prescribed Lexapro, never tried.  Feeling great Occasional insomnia, well controlled with melatonin as needed  Review of Systems See above   Past Medical History:  Diagnosis Date   Hiatal hernia    NSVD (normal spontaneous vaginal delivery)     Past Surgical History:  Procedure Laterality Date   ABDOMINAL HYSTERECTOMY     TVH/ A&P REPAIR   DILATION AND CURETTAGE OF UTERUS     x2    Current Outpatient Medications  Medication Instructions   cholecalciferol (VITAMIN D3) 2,000 Units, Oral, Daily   doxycycline (VIBRA-TABS) 100 mg, Oral, 2 times daily   escitalopram (LEXAPRO) 10 mg, Oral, Daily       Objective:   Physical Exam BP 122/64   Pulse 62   Temp 97.9 F (36.6 C) (Oral)   Resp 16   Ht 4' 11.25" (1.505 m)   Wt 148 lb 4 oz (67.2 kg)   LMP 04/22/2005   SpO2 97%   BMI 29.69 kg/m  General:   Well developed, NAD, BMI noted. HEENT:  Normocephalic . Face symmetric, atraumatic Lower extremities: no pretibial edema bilaterally  Skin: Not pale. Not jaundice Neurologic:  alert & oriented X3.  Speech normal, gait appropriate for age and unassisted Psych--  Cognition and judgment appear intact.  Cooperative with normal attention span and concentration.  Behavior appropriate. No anxious or depressed appearing.      Assessment    Assessment (transfer to me 05/26/2021) A1C 5.8 Obesity Hysterectomy (TVH) LFTs elevation  PLAN Anxiety: Since last visit, had a lot of stress related to her marriage, was Rx Lexapro, decided not to take it.  Had 3 visits with a therapist  which was helpful.  Eventually she had a long conversation with her husband and things are much improved.  Currently with essentially no symptoms except occasional insomnia. Listening therapy provided again  today. Vaccine advice: See AVS RTC 11-2018 for CPX

## 2022-01-19 NOTE — Patient Instructions (Addendum)
Recommend to proceed with the following vaccines at your pharmacy:  Flu shot Covid vaccine      GO TO THE FRONT DESK, PLEASE SCHEDULE YOUR APPOINTMENTS Come back for  a physical exam by 11-2022

## 2022-01-19 NOTE — Assessment & Plan Note (Signed)
Anxiety: Since last visit, had a lot of stress related to her marriage, was Rx Lexapro, decided not to take it.  Had 3 visits with a therapist  which was helpful.  Eventually she had a long conversation with her husband and things are much improved.  Currently with essentially no symptoms except occasional insomnia. Listening therapy provided again today. Vaccine advice: See AVS RTC 11-2018 for CPX

## 2022-02-09 DIAGNOSIS — L814 Other melanin hyperpigmentation: Secondary | ICD-10-CM | POA: Diagnosis not present

## 2022-02-09 DIAGNOSIS — L918 Other hypertrophic disorders of the skin: Secondary | ICD-10-CM | POA: Diagnosis not present

## 2022-02-09 DIAGNOSIS — L570 Actinic keratosis: Secondary | ICD-10-CM | POA: Diagnosis not present

## 2022-03-30 DIAGNOSIS — B351 Tinea unguium: Secondary | ICD-10-CM | POA: Diagnosis not present

## 2022-03-30 DIAGNOSIS — L918 Other hypertrophic disorders of the skin: Secondary | ICD-10-CM | POA: Diagnosis not present

## 2022-03-30 DIAGNOSIS — D225 Melanocytic nevi of trunk: Secondary | ICD-10-CM | POA: Diagnosis not present

## 2022-03-30 DIAGNOSIS — L814 Other melanin hyperpigmentation: Secondary | ICD-10-CM | POA: Diagnosis not present

## 2022-05-16 DIAGNOSIS — Z1231 Encounter for screening mammogram for malignant neoplasm of breast: Secondary | ICD-10-CM | POA: Diagnosis not present

## 2022-05-21 ENCOUNTER — Encounter: Payer: Self-pay | Admitting: Obstetrics & Gynecology

## 2022-05-22 ENCOUNTER — Encounter: Payer: Self-pay | Admitting: Obstetrics & Gynecology

## 2022-05-29 ENCOUNTER — Other Ambulatory Visit: Payer: Self-pay | Admitting: Gastroenterology

## 2022-05-29 DIAGNOSIS — R1033 Periumbilical pain: Secondary | ICD-10-CM | POA: Diagnosis not present

## 2022-05-29 DIAGNOSIS — E669 Obesity, unspecified: Secondary | ICD-10-CM | POA: Diagnosis not present

## 2022-05-29 DIAGNOSIS — R1011 Right upper quadrant pain: Secondary | ICD-10-CM | POA: Diagnosis not present

## 2022-05-29 DIAGNOSIS — K5730 Diverticulosis of large intestine without perforation or abscess without bleeding: Secondary | ICD-10-CM | POA: Diagnosis not present

## 2022-05-29 DIAGNOSIS — R1031 Right lower quadrant pain: Secondary | ICD-10-CM

## 2022-06-19 ENCOUNTER — Ambulatory Visit
Admission: RE | Admit: 2022-06-19 | Discharge: 2022-06-19 | Disposition: A | Discharge status: Home / Self Care | Payer: BC Managed Care – PPO | Source: Ambulatory Visit | Attending: Gastroenterology | Admitting: Gastroenterology

## 2022-06-19 DIAGNOSIS — K760 Fatty (change of) liver, not elsewhere classified: Secondary | ICD-10-CM | POA: Diagnosis not present

## 2022-06-19 DIAGNOSIS — R1031 Right lower quadrant pain: Secondary | ICD-10-CM

## 2022-06-19 DIAGNOSIS — Z90710 Acquired absence of both cervix and uterus: Secondary | ICD-10-CM | POA: Diagnosis not present

## 2022-06-19 DIAGNOSIS — R1011 Right upper quadrant pain: Secondary | ICD-10-CM

## 2022-06-19 DIAGNOSIS — K5730 Diverticulosis of large intestine without perforation or abscess without bleeding: Secondary | ICD-10-CM | POA: Diagnosis not present

## 2022-06-19 MED ORDER — IOPAMIDOL (ISOVUE-300) INJECTION 61%
100.0000 mL | Freq: Once | INTRAVENOUS | Status: AC | PRN
  Administered 2022-06-19: 100 mL via INTRAVENOUS

## 2022-06-28 ENCOUNTER — Other Ambulatory Visit: Payer: BC Managed Care – PPO

## 2022-07-09 ENCOUNTER — Encounter: Payer: Self-pay | Admitting: Obstetrics & Gynecology

## 2022-07-09 ENCOUNTER — Ambulatory Visit (INDEPENDENT_AMBULATORY_CARE_PROVIDER_SITE_OTHER): Payer: BC Managed Care – PPO | Admitting: Obstetrics & Gynecology

## 2022-07-09 ENCOUNTER — Other Ambulatory Visit (HOSPITAL_COMMUNITY)
Admission: RE | Admit: 2022-07-09 | Discharge: 2022-07-09 | Disposition: A | Discharge status: Home / Self Care | Payer: BC Managed Care – PPO | Source: Ambulatory Visit | Attending: Obstetrics & Gynecology | Admitting: Obstetrics & Gynecology

## 2022-07-09 VITALS — BP 122/80 | HR 64 | Ht 61.0 in | Wt 151.0 lb

## 2022-07-09 DIAGNOSIS — Z01419 Encounter for gynecological examination (general) (routine) without abnormal findings: Secondary | ICD-10-CM | POA: Diagnosis not present

## 2022-07-09 DIAGNOSIS — Z113 Encounter for screening for infections with a predominantly sexual mode of transmission: Secondary | ICD-10-CM

## 2022-07-09 DIAGNOSIS — Z1272 Encounter for screening for malignant neoplasm of vagina: Secondary | ICD-10-CM

## 2022-07-09 DIAGNOSIS — Z90710 Acquired absence of both cervix and uterus: Secondary | ICD-10-CM

## 2022-07-09 NOTE — Progress Notes (Addendum)
Cheryl Booth 02/11/1966 [id number]   History:    57 y.o. G2P1A1L1 Married. 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 [place]  Mother died of pancreatic Ca in 07-24-2020.   RP:  Established patient presenting for annual gyn exam    HPI: Status post total hysterectomy.  History of anterior and posterior repair.  No menopausal syndrome.  No pelvic pain.  Pap neg 05/2019. Sexually active, but low libido.  Husband had an affair.  Pap and STI screen today. Breasts normal. Mammo Neg 05/2022. Will obtain report of Mammo 04/2020.  Urine normal.  No stress urinary incontinence.  Bowel movements normal.  Body mass index 28.53.  Physically active at work but no fitness activities.  F/U here for Fasting Health labs. Colono 05/13/2020.   Past medical history,surgical history, family history and social history were all reviewed and documented in the EPIC chart.  Gynecologic History Patient's last menstrual period was 04/22/2005.  Obstetric History OB History  Gravida Para Term Preterm AB Living  '2 1 1   1 1  '$ SAB IAB Ectopic Multiple Live Births  1       1    # Outcome Date GA Lbr Len/2nd Weight Sex Delivery Anes PTL Lv  2 SAB           1 Term     M Vag-Spont  N LIV     ROS: A ROS was performed and pertinent positives and negatives are included in the history. GENERAL: No fevers or chills. HEENT: No change in vision, no earache, sore throat or sinus congestion. NECK: No pain or stiffness. CARDIOVASCULAR: No chest pain or pressure. No palpitations. PULMONARY: No shortness of breath, cough or wheeze. GASTROINTESTINAL: No abdominal pain, nausea, vomiting or diarrhea, melena or bright red blood per rectum. GENITOURINARY: No urinary frequency, urgency, hesitancy or dysuria. MUSCULOSKELETAL: No joint or muscle pain, no back pain, no recent trauma. DERMATOLOGIC: No rash, no itching, no lesions. ENDOCRINE: No polyuria, polydipsia, no heat or cold intolerance. No recent change in weight.  HEMATOLOGICAL: No anemia or easy bruising or bleeding. NEUROLOGIC: No headache, seizures, numbness, tingling or weakness. PSYCHIATRIC: No depression, no loss of interest in normal activity or change in sleep pattern.     Exam:   BP 122/80 (BP Location: Right Arm, Patient Position: Sitting, Cuff Size: Normal)   Pulse 64   Ht '5\' 1"'$  (1.549 m)   Wt 151 lb (68.5 kg)   LMP 04/22/2005   SpO2 92%   BMI 28.53 kg/m   Body mass index is 28.53 kg/m.  General appearance : Well developed well nourished female. No acute distress HEENT: Eyes: no retinal hemorrhage or exudates,  Neck supple, trachea midline, no carotid bruits, no thyroidmegaly Lungs: Clear to auscultation, no rhonchi or wheezes, or rib retractions  Heart: Regular rate and rhythm, no murmurs or gallops Breast:Examined in sitting and supine position were symmetrical in appearance, no palpable masses or tenderness,  no skin retraction, no nipple inversion, no nipple discharge, no skin discoloration, no axillary or supraclavicular lymphadenopathy Abdomen: no palpable masses or tenderness, no rebound or guarding Extremities: no edema or skin discoloration or tenderness  Pelvic: Vulva: Normal             Vagina: No gross lesions or discharge.  Pap/HPV HR done.  Cervix/Uterus absent  Adnexa  Without masses or tenderness  Anus: Normal   Assessment/Plan:  57 y.o. female for annual exam   1. Encounter for Papanicolaou smear  of vagina as part of routine gynecological examination Status post total hysterectomy.  History of anterior and posterior repair.  No menopausal syndrome.  No pelvic pain.  Pap neg 05/2019. Sexually active, but low libido.  Husband had an affair.  Counseling on low libido. Will try to work on communicating her preferences and needs to her husband.  May call back to start on Testo gel. Pap and STI screen today. Breasts normal. Mammo Neg 05/2022. Will obtain report of Mammo 04/2020.  Urine normal.  No stress urinary  incontinence.  Bowel movements normal.  Body mass index 28.53.  Physically active at work but no fitness activities.  F/U here for Fasting Health labs. Colono 05/13/2020. - Cytology - PAP( [facility])/HPV HR - CBC; Future - Comp Met (CMET); Future - TSH; Future - Lipid Profile; Future - Vitamin D (25 hydroxy); Future  2. S/P total hysterectomy  3. Screen for STD (sexually transmitted disease) - HIV antibody (with reflex) - RPR - Hepatitis B Surface AntiGEN - Hepatitis C Antibody - Cytology - PAP( [facility]) Gono-Chlam  Other orders - Magnesium 250 MG TABS; Take by mouth.   Princess Bruins MD, 3:38 PM

## 2022-07-10 ENCOUNTER — Other Ambulatory Visit: Payer: BC Managed Care – PPO

## 2022-07-10 DIAGNOSIS — Z01419 Encounter for gynecological examination (general) (routine) without abnormal findings: Secondary | ICD-10-CM | POA: Diagnosis not present

## 2022-07-10 DIAGNOSIS — Z1272 Encounter for screening for malignant neoplasm of vagina: Secondary | ICD-10-CM | POA: Diagnosis not present

## 2022-07-10 LAB — CYTOLOGY - PAP
Chlamydia: NEGATIVE
Comment: NEGATIVE
Comment: NEGATIVE
Comment: NORMAL
Diagnosis: NEGATIVE
High risk HPV: NEGATIVE
Neisseria Gonorrhea: NEGATIVE

## 2022-07-10 LAB — HEPATITIS C ANTIBODY: Hepatitis C Ab: NONREACTIVE

## 2022-07-10 LAB — RPR: RPR Ser Ql: NONREACTIVE

## 2022-07-10 LAB — HEPATITIS B SURFACE ANTIGEN: Hepatitis B Surface Ag: NONREACTIVE

## 2022-07-10 LAB — HIV ANTIBODY (ROUTINE TESTING W REFLEX): HIV 1&2 Ab, 4th Generation: NONREACTIVE

## 2022-07-11 LAB — CBC
HCT: 41.1 % (ref 35.0–45.0)
Hemoglobin: 13.6 g/dL (ref 11.7–15.5)
MCH: 29 pg (ref 27.0–33.0)
MCHC: 33.1 g/dL (ref 32.0–36.0)
MCV: 87.6 fL (ref 80.0–100.0)
MPV: 10 fL (ref 7.5–12.5)
Platelets: 312 10*3/uL (ref 140–400)
RBC: 4.69 10*6/uL (ref 3.80–5.10)
RDW: 11.9 % (ref 11.0–15.0)
WBC: 7.6 10*3/uL (ref 3.8–10.8)

## 2022-07-11 LAB — LIPID PANEL
Cholesterol: 198 mg/dL (ref <200)
HDL: 58 mg/dL (ref >=50)
LDL Cholesterol (Calc): 115 mg/dL (calc) — ABNORMAL HIGH
Non-HDL Cholesterol (Calc): 140 mg/dL (calc) — ABNORMAL HIGH (ref <130)
Total CHOL/HDL Ratio: 3.4 (calc) (ref <5.0)
Triglycerides: 130 mg/dL (ref <150)

## 2022-07-11 LAB — COMPREHENSIVE METABOLIC PANEL
AG Ratio: 1.6 (calc) (ref 1.0–2.5)
ALT: 11 U/L (ref 6–29)
AST: 14 U/L (ref 10–35)
Albumin: 4.5 g/dL (ref 3.6–5.1)
Alkaline phosphatase (APISO): 76 U/L (ref 37–153)
BUN: 17 mg/dL (ref 7–25)
CO2: 28 mmol/L (ref 20–32)
Calcium: 9.4 mg/dL (ref 8.6–10.4)
Chloride: 105 mmol/L (ref 98–110)
Creat: 0.63 mg/dL (ref 0.50–1.03)
Globulin: 2.9 g/dL (calc) (ref 1.9–3.7)
Glucose, Bld: 94 mg/dL (ref 65–99)
Potassium: 4.4 mmol/L (ref 3.5–5.3)
Sodium: 140 mmol/L (ref 135–146)
Total Bilirubin: 0.5 mg/dL (ref 0.2–1.2)
Total Protein: 7.4 g/dL (ref 6.1–8.1)

## 2022-07-11 LAB — VITAMIN D 25 HYDROXY (VIT D DEFICIENCY, FRACTURES): Vit D, 25-Hydroxy: 29 ng/mL — ABNORMAL LOW (ref 30–100)

## 2022-07-11 LAB — TSH: TSH: 0.91 mIU/L (ref 0.40–4.50)

## 2022-11-16 ENCOUNTER — Ambulatory Visit (INDEPENDENT_AMBULATORY_CARE_PROVIDER_SITE_OTHER): Payer: BC Managed Care – PPO | Admitting: Internal Medicine

## 2022-11-16 ENCOUNTER — Encounter: Payer: Self-pay | Admitting: Internal Medicine

## 2022-11-16 VITALS — BP 134/68 | HR 61 | Temp 98.4°F | Resp 16 | Ht 59.25 in | Wt 155.0 lb

## 2022-11-16 DIAGNOSIS — Z0000 Encounter for general adult medical examination without abnormal findings: Secondary | ICD-10-CM

## 2022-11-16 NOTE — Patient Instructions (Addendum)
Vaccines I recommend: Covid vaccines  Tdap (tetanus) Shingrix (shingles) Flu shot this fall    GO TO THE FRONT DESK, PLEASE SCHEDULE YOUR APPOINTMENTS Come back for a physical exam in 1 year    "Health Care Power of attorney" ,  "Living will" (Advance care planning documents)  If you already have a living will or healthcare power of attorney, is recommended you bring the copy to be scanned in your chart.   The document will be available to all the doctors you see in the system.  Advance care planning is a process that supports adults in  understanding and sharing their preferences regarding future medical care.  The patient's preferences are recorded in documents called Advance Directives and the can be modified at any time while the patient is in full mental capacity.   If you don't have one, please consider create one.      More information at: [email or web address]

## 2022-11-16 NOTE — Progress Notes (Signed)
Subjective:    Patient ID: Cheryl Booth, female    DOB: [birth year], 57 y.o.   MRN: [id number]  DOS:  11/16/2022 Type of visit - description: CPX  Here for CPX. In general feels well.  Wt Readings from Last 3 Encounters:  11/16/22 155 lb (70.3 kg)  07/09/22 151 lb (68.5 kg)  01/19/22 148 lb 4 oz (67.2 kg)   Review of Systems  A 14 point review of systems is negative    Past Medical History:  Diagnosis Date   Hiatal hernia    NSVD (normal spontaneous vaginal delivery)     Past Surgical History:  Procedure Laterality Date   ABDOMINAL HYSTERECTOMY     TVH/ A&P REPAIR   DILATION AND CURETTAGE OF UTERUS     x2   Social History   Socioeconomic History   Marital status: Married    Spouse name: Not on file   Number of children: 1   Years of education: Not on file   Highest education level: Not on file  Occupational History   Occupation: works at  [employer]  Tobacco Use   Smoking status: Never   Smokeless tobacco: Never  Vaping Use   Vaping Use: Never used  Substance and Sexual Activity   Alcohol use: No   Drug use: No   Sexual activity: Yes    Partners: Male    Birth control/protection: Surgical    Comment: 1st intercourse- 19, partners- 5, married- 21 yrs, hysterectomy  Other Topics Concern   Not on file  Social History Narrative   Originally from [place].   Household: pt and husband   Lost mother Shalece Herrera (my patient) to pancreatic cancer July 2022   Social Determinants of Health   Financial Resource Strain: Not on file  Food Insecurity: Not on file  Transportation Needs: Not on file  Physical Activity: Not on file  Stress: Not on file  Social Connections: Not on file  Intimate Partner Violence: Not on file    Current Outpatient Medications  Medication Instructions   cholecalciferol (VITAMIN D3) 2,000 Units, Oral, Daily   Magnesium 500 mg, Oral, Daily       Objective:   Physical Exam BP 134/68   Pulse 61   Temp 98.4 F (36.9 C) (Oral)    Resp 16   Ht 4' 11.25" (1.505 m)   Wt 155 lb (70.3 kg)   LMP 04/22/2005   SpO2 97%   BMI 31.04 kg/m  General: Well developed, NAD, BMI noted Neck: No  thyromegaly  HEENT:  Normocephalic . Face symmetric, atraumatic Lungs:  CTA B Normal respiratory effort, no intercostal retractions, no accessory muscle use. Heart: RRR,  no murmur.  Abdomen:  Not distended, soft, non-tender. No rebound or rigidity.   Lower extremities: no pretibial edema bilaterally  Skin: Exposed areas without rash. Not pale. Not jaundice Neurologic:  alert & oriented X3.  Speech normal, gait appropriate for age and unassisted Strength symmetric and appropriate for age.  Psych: Cognition and judgment appear intact.  Cooperative with normal attention span and concentration.  Behavior appropriate. No anxious or depressed appearing.     Assessment     Assessment (transfer to me 05/26/2021) A1C 5.8 Obesity Hysterectomy (TVH) LFTs elevation Vitamin D deficiency  PLAN Here for CPX -Vaccine advice provided, see AVS. -Female care: per gyn;  Mammogram 05/2022 -[facility]: Colonoscopy 05/13/2020, next 10 years per report. - Bones: No previous DEXA, s/p hysterectomy years ago, no oophorectomy, never had hot flashes  or other menopausal symptoms.  Plan: Consider DEXA next year if not done by gynecology - Labs reviewed, no need for blood work today. - Healthcare POA: Information provided - Has gained some weight, plans to do better with diet and exercise more.  Praised. Anxiety: See previous visits, sxs resolved Vitamin D deficiency: Per labs 3 months ago, was rec to increase vitamin D to 2000 units a day which she is doing. FH of multiple cancers: Discussed patient the option and benefits of seeing a geneticist.  Declines for now. RTC 1 year

## 2022-11-16 NOTE — Assessment & Plan Note (Signed)
-  Vaccine advice provided, see AVS. -Female care: per gyn;  Mammogram 05/2022 -CCS: Colonoscopy 05/13/2020, next 10 years per report. - Bones: No previous DEXA, s/p hysterectomy years ago, no oophorectomy, never had hot flashes or other menopausal symptoms.  Plan: Consider DEXA next year if not done by gynecology - Labs reviewed, no need for blood work today. - Healthcare POA: Information provided - Has gained some weight, plans to do better with diet and exercise more.  Praised.

## 2022-11-16 NOTE — Assessment & Plan Note (Signed)
Here for CPX Anxiety: See previous visits, sxs resolved Vitamin D deficiency: Per labs 3 months ago, was rec to increase vitamin D to 2000 units a day which she is doing. FH of multiple cancers: Discussed patient the option and benefits of seeing a geneticist.  Declines for now. RTC 1 year

## 2023-01-17 ENCOUNTER — Encounter: Payer: Self-pay | Admitting: Internal Medicine

## 2023-01-18 ENCOUNTER — Ambulatory Visit: Payer: BC Managed Care – PPO | Admitting: Internal Medicine

## 2023-01-18 ENCOUNTER — Encounter: Payer: Self-pay | Admitting: Internal Medicine

## 2023-01-18 VITALS — BP 122/72 | HR 70 | Temp 98.3°F | Resp 16 | Ht 59.25 in | Wt 150.5 lb

## 2023-01-18 DIAGNOSIS — J019 Acute sinusitis, unspecified: Secondary | ICD-10-CM

## 2023-01-18 DIAGNOSIS — J40 Bronchitis, not specified as acute or chronic: Secondary | ICD-10-CM

## 2023-01-18 MED ORDER — AZITHROMYCIN 250 MG PO TABS: ORAL_TABLET | ORAL | 0 refills | Status: DC

## 2023-01-18 MED ORDER — PREDNISONE 10 MG PO TABS: ORAL_TABLET | ORAL | 0 refills | Status: DC

## 2023-01-18 NOTE — Patient Instructions (Signed)
Stop amoxicillin  Start Zithromax, different antibiotic  Start Flonase over-the-counter: 2 sprays on each side of the nose daily until better  Okay to continue Mucinex DM as needed  Call if not gradually better

## 2023-01-18 NOTE — Progress Notes (Signed)
   Subjective:    Patient ID: Cheryl Booth, female    DOB: 26-Jun-1965, 57 y.o.   MRN: [id number]  DOS:  01/18/2023 Type of visit - description: acute  Had a viral syndrome 12/24/2022, COVID test OTC were negative but she assume it was COVID.  Symptoms eventually resolved.  10 days ago, at work, was exposed to dust and immediately developed symptoms: Sore throat, itchy throat, cough, sinus congestion. She reports that for a couple of days had fever up to 102.0.  She is here because symptoms continue, chest is congested, yesterday she had a temperature of 100.0. Denies nausea vomiting.  No myalgias.  She has taken a leftover amoxicillin "3 or 4 times".  Review of Systems See above   Past Medical History:  Diagnosis Date   Hiatal hernia    NSVD (normal spontaneous vaginal delivery)     Past Surgical History:  Procedure Laterality Date   ABDOMINAL HYSTERECTOMY     TVH/ A&P REPAIR   DILATION AND CURETTAGE OF UTERUS     x2    Current Outpatient Medications  Medication Instructions   cholecalciferol (VITAMIN D3) 2,000 Units, Oral, Daily   Magnesium 500 mg, Oral, Daily       Objective:   Physical Exam BP 122/72   Pulse 70   Temp 98.3 F (36.8 C) (Oral)   Resp 16   Ht 4' 11.25" (1.505 m)   Wt 150 lb 8 oz (68.3 kg)   LMP 04/22/2005   SpO2 98%   BMI 30.14 kg/m  General:   Well developed, NAD, BMI noted. HEENT:  Normocephalic . Face symmetric, atraumatic TMs: Normal. Throat: Symmetric, no red. Lungs:  CTA B Normal respiratory effort, no intercostal retractions, no accessory muscle use. Heart: RRR,  no murmur.  Lower extremities: no pretibial edema bilaterally  Skin: Not pale. Not jaundice Neurologic:  alert & oriented X3.  Speech normal, gait appropriate for age and unassisted Psych--  Cognition and judgment appear intact.  Cooperative with normal attention span and concentration.  Behavior appropriate. No anxious or depressed appearing.      Assessment      Assessment (transfer to me 05/26/2021) A1C 5.8 Obesity Hysterectomy (TVH) LFTs elevation Vitamin D deficiency  PLAN Sinusitis/bronchitis: Symptoms consistent with above diagnosis, is hard to distinguish if this is infectious (had a  fever) or allergic (c/o of some itching in the throat). Will go ahead and treat for both with Zithromax, prednisone, Flonase, Mucinex. Call if not gradually better.

## 2023-01-18 NOTE — Assessment & Plan Note (Signed)
Sinusitis/bronchitis: Symptoms consistent with above diagnosis, is hard to distinguish if this is infectious (had a  fever) or allergic (c/o of some itching in the throat). Will go ahead and treat for both with Zithromax, prednisone, Flonase, Mucinex. Call if not gradually better.

## 2023-03-12 DIAGNOSIS — L578 Other skin changes due to chronic exposure to nonionizing radiation: Secondary | ICD-10-CM | POA: Diagnosis not present

## 2023-03-12 DIAGNOSIS — D225 Melanocytic nevi of trunk: Secondary | ICD-10-CM | POA: Diagnosis not present

## 2023-03-12 DIAGNOSIS — L820 Inflamed seborrheic keratosis: Secondary | ICD-10-CM | POA: Diagnosis not present

## 2023-04-30 ENCOUNTER — Ambulatory Visit: Payer: BC Managed Care – PPO | Admitting: Obstetrics and Gynecology

## 2023-04-30 ENCOUNTER — Encounter: Payer: Self-pay | Admitting: Obstetrics and Gynecology

## 2023-04-30 VITALS — BP 110/68 | HR 67 | Temp 98.6°F

## 2023-04-30 DIAGNOSIS — E2839 Other primary ovarian failure: Secondary | ICD-10-CM

## 2023-04-30 DIAGNOSIS — N8110 Cystocele, unspecified: Secondary | ICD-10-CM

## 2023-04-30 DIAGNOSIS — N898 Other specified noninflammatory disorders of vagina: Secondary | ICD-10-CM | POA: Diagnosis not present

## 2023-04-30 DIAGNOSIS — N3001 Acute cystitis with hematuria: Secondary | ICD-10-CM | POA: Diagnosis not present

## 2023-04-30 DIAGNOSIS — R102 Pelvic and perineal pain: Secondary | ICD-10-CM

## 2023-04-30 MED ORDER — CEFTRIAXONE SODIUM 1 G IJ SOLR
1.0000 g | INTRAMUSCULAR | Status: DC
Start: 2023-04-30 — End: 2023-05-27
  Administered 2023-04-30: 1 g via INTRAMUSCULAR

## 2023-04-30 MED ORDER — FLUCONAZOLE 150 MG PO TABS: 150.0000 mg | ORAL_TABLET | Freq: Once | ORAL | 1 refills | Status: AC

## 2023-04-30 NOTE — Progress Notes (Signed)
   Acute Office Visit  Subjective:    Patient ID: Cheryl Booth, female    DOB: 05-Feb-1966, 57 y.o.   MRN: [id number]   HPI 57 y.o. presents today for Urinary Tract Infection (Pt c/o pelvic pain & urine odor, cloudy//jj) . Reports supra pubic pelvic pain. No urgency, frequency, upper back pain or fevers but urine is odorous and thick. Feels a bulge. 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    Review of Systems     Objective:    Physical Exam Genitourinary:     Genitourinary Comments: Stage 2-3 cystocele     Anterior vaginal prolapse present.    Cervix is absent.     Uterus is absent.     BP 110/68   Pulse 67   Temp 98.6 F (37 C) (Oral)   LMP 04/22/2005   SpO2 99%  Wt Readings from Last 3 Encounters:  01/18/23 150 lb 8 oz (68.3 kg)  11/16/22 155 lb (70.3 kg)  07/09/22 151 lb (68.5 kg)        Patient informed chaperone available to be present for breast and/or pelvic exam. Patient has requested no chaperone to be present. Patient has been advised what will be completed during breast and pelvic exam.   Assessment & Plan:  UTI, cystocele, yeast infection Rocephin given for UTI.  Counseled to return with persistent or worsening s/s.  She agreed Diflucan sent for yeast infection.  May repeat dose in 48 hours with persistent symptoms Referral to urogyn for options for cystocele.   To have TV US to evaluate ovaries for pelvic pain.  Earley Favor

## 2023-05-01 DIAGNOSIS — N898 Other specified noninflammatory disorders of vagina: Secondary | ICD-10-CM | POA: Diagnosis not present

## 2023-05-01 LAB — WET PREP FOR TRICH, YEAST, CLUE

## 2023-05-01 NOTE — Addendum Note (Signed)
Addended by: Earley Favor on: 05/01/2023 11:46 AM   Modules accepted: Orders

## 2023-05-02 LAB — URINE CULTURE
MICRO NUMBER:: 15860505
Result:: NO GROWTH
SPECIMEN QUALITY:: ADEQUATE

## 2023-05-02 LAB — URINALYSIS, COMPLETE W/RFL CULTURE
Bilirubin Urine: NEGATIVE
Glucose, UA: NEGATIVE
Hyaline Cast: NONE SEEN /LPF
Ketones, ur: NEGATIVE
Nitrites, Initial: NEGATIVE
Protein, ur: NEGATIVE
Specific Gravity, Urine: 1.02 (ref 1.001–1.035)
pH: 5.5 (ref 5.0–8.0)

## 2023-05-02 LAB — CULTURE INDICATED

## 2023-05-22 DIAGNOSIS — Z1231 Encounter for screening mammogram for malignant neoplasm of breast: Secondary | ICD-10-CM | POA: Diagnosis not present

## 2023-05-24 ENCOUNTER — Encounter: Payer: Self-pay | Admitting: Obstetrics and Gynecology

## 2023-05-26 ENCOUNTER — Encounter: Payer: Self-pay | Admitting: Internal Medicine

## 2023-05-27 ENCOUNTER — Encounter: Payer: Self-pay | Admitting: Internal Medicine

## 2023-05-27 ENCOUNTER — Ambulatory Visit: Payer: BC Managed Care – PPO | Admitting: Internal Medicine

## 2023-05-27 VITALS — BP 116/78 | HR 75 | Temp 98.3°F | Resp 16 | Ht 59.25 in | Wt 152.0 lb

## 2023-05-27 DIAGNOSIS — J020 Streptococcal pharyngitis: Secondary | ICD-10-CM | POA: Diagnosis not present

## 2023-05-27 LAB — POCT RAPID STREP A (OFFICE): Rapid Strep A Screen: POSITIVE — AB

## 2023-05-27 LAB — POC COVID19 BINAXNOW: SARS Coronavirus 2 Ag: NEGATIVE

## 2023-05-27 MED ORDER — AMOXICILLIN 875 MG PO TABS: 875.0000 mg | ORAL_TABLET | Freq: Two times a day (BID) | ORAL | 0 refills | Status: DC

## 2023-05-27 NOTE — Assessment & Plan Note (Signed)
 Strep pharyngitis: Strep test +, COVID test (-). Despite benign appearance of the throat, the strep test came back positive thus we will Rx amoxicillin  x 10 days. In the future, recommend not to self prescribed amoxicillin  unless she is tested for strep   See AVS

## 2023-05-27 NOTE — Progress Notes (Signed)
   Subjective:    Patient ID: Cheryl Booth, female    DOB: February 01, 1966, 58 y.o.   MRN: [id number]  DOS:  05/27/2023 Type of visit - description: Acute   Symptoms started 3 days ago with sore throat. Initially bilateral, now worse on the left. Had fever on and off as high as 101. This morning at home temperature was 100, did not take Tylenol  or ibuprofen, temperature is now 98.3.  Admits to very mild cough with no sputum production and mild sinus congestion. No sick contacts that she can recall.  Patient took 3 capsules of amoxicillin  with the onset of symptoms  Review of Systems See above   Past Medical History:  Diagnosis Date   Hiatal hernia    NSVD (normal spontaneous vaginal delivery)     Past Surgical History:  Procedure Laterality Date   ABDOMINAL HYSTERECTOMY     TVH/ A&P REPAIR   DILATION AND CURETTAGE OF UTERUS     x2    Current Outpatient Medications  Medication Instructions   cholecalciferol (VITAMIN D3) 2,000 Units, Daily   Magnesium 500 mg, Daily       Objective:   Physical Exam BP 116/78   Pulse 75   Temp 98.3 F (36.8 C) (Oral)   Resp 16   Ht 4' 11.25 (1.505 m)   Wt 152 lb (68.9 kg)   LMP 04/22/2005   SpO2 96%   BMI 30.44 kg/m  General:   Well developed, NAD, BMI noted. HEENT:  Normocephalic . Face symmetric, atraumatic Nose: Mildly congested TMs: Minimally bulge, no red. Throat: Tonsils are normal in size, uvula midline, no redness, no white patches. Lungs:  CTA B Normal respiratory effort, no intercostal retractions, no accessory muscle use. Heart: RRR,  no murmur.  Lower extremities: no pretibial edema bilaterally  Skin: Not pale. Not jaundice Neurologic:  alert & oriented X3.  Speech normal, gait appropriate for age and unassisted Psych--  Cognition and judgment appear intact.  Cooperative with normal attention span and concentration.  Behavior appropriate. No anxious or depressed appearing.      Assessment   Assessment  (transfer to me 05/26/2021) A1C 5.8 Obesity Hysterectomy (TVH) LFTs elevation Vitamin D  deficiency  PLAN Strep pharyngitis: Strep test +, COVID test (-). Despite benign appearance of the throat, the strep test came back positive thus we will Rx amoxicillin  x 10 days. In the future, recommend not to self prescribed amoxicillin  unless she is tested for strep   See AVS

## 2023-05-27 NOTE — Patient Instructions (Signed)
  Rest, fluids , tylenol   Take the antibiotic as prescribed for 10 days even if you feel better  If cough:  Take Mucinex DM or Robitussin-DM OTC.  Follow the instructions in the box.   For nasal congestion:  -Use OTC Astepro 2 nasal sprays on each side of the nose twice daily until better     Call if not gradually better over the next  10 days   Call anytime if the symptoms are severe, you have high fever

## 2023-06-13 ENCOUNTER — Encounter: Payer: Self-pay | Admitting: Obstetrics and Gynecology

## 2023-06-13 ENCOUNTER — Ambulatory Visit (INDEPENDENT_AMBULATORY_CARE_PROVIDER_SITE_OTHER): Payer: BC Managed Care – PPO

## 2023-06-13 ENCOUNTER — Ambulatory Visit (INDEPENDENT_AMBULATORY_CARE_PROVIDER_SITE_OTHER): Payer: BC Managed Care – PPO | Admitting: Obstetrics and Gynecology

## 2023-06-13 VITALS — BP 120/78 | HR 70

## 2023-06-13 DIAGNOSIS — Z712 Person consulting for explanation of examination or test findings: Secondary | ICD-10-CM | POA: Diagnosis not present

## 2023-06-13 DIAGNOSIS — E2839 Other primary ovarian failure: Secondary | ICD-10-CM

## 2023-06-13 DIAGNOSIS — R102 Pelvic and perineal pain: Secondary | ICD-10-CM

## 2023-06-13 NOTE — Progress Notes (Signed)
   Acute Office Visit  Subjective:    Patient ID: Cheryl Booth, female    DOB: May 09, 1966, 58 y.o.   MRN: [id number]   HPI 58 y.o. presents today for ultrasound & consult (Ultrasound & consult//jj) . Reports supra pubic pelvic pressure. No urgency, frequency, upper back pain or fevers but urine is odorous and thick. Feels a bulge. 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  PUS today with  Vaginal cuff WNL Cervix and uterus are surgically absent Both ovaries WNL No adnexal masses  No free fluid Patient's last menstrual period was 04/22/2005.    Review of Systems     Objective:    Physical Exam Genitourinary:     Genitourinary Comments: Stage 2-3 cystocele     Anterior vaginal prolapse present.    Cervix is absent.     Uterus is absent.     BP 120/78   Pulse 70   LMP 04/22/2005   SpO2 98%  Wt Readings from Last 3 Encounters:  05/27/23 152 lb (68.9 kg)  01/18/23 150 lb 8 oz (68.3 kg)  11/16/22 155 lb (70.3 kg)        Patient informed chaperone available to be present for breast and/or pelvic exam. Patient has requested no chaperone to be present. Patient has been advised what will be completed during breast and pelvic exam.   Assessment & Plan:  UTI, cystocele, yeast infection PUS results discussed and counseled with patient and was normal Referral to urogyn for options for cystocele.   3.  Counseled on importance of estrogen cream 4. Baseline dxa ordered  10 minutes spent on reviewing records, imaging,  and one on one patient time and counseling patient and documentation Dr. Judith Blonder

## 2023-06-13 NOTE — Patient Instructions (Signed)
Lanissa, It was so nice seeing you again today!  I wanted to thank you for the chocolates.  I shared with the team and everyone loved them!  It was very thoughtful of you! Dr. Karma Greaser

## 2023-06-24 ENCOUNTER — Encounter: Payer: Self-pay | Admitting: Obstetrics and Gynecology

## 2023-07-12 ENCOUNTER — Ambulatory Visit: Payer: BC Managed Care – PPO | Admitting: Obstetrics and Gynecology

## 2023-07-22 ENCOUNTER — Encounter: Payer: Self-pay | Admitting: Obstetrics and Gynecology

## 2023-07-22 ENCOUNTER — Ambulatory Visit (INDEPENDENT_AMBULATORY_CARE_PROVIDER_SITE_OTHER): Payer: BC Managed Care – PPO | Admitting: Obstetrics and Gynecology

## 2023-07-22 VITALS — BP 130/84 | HR 70 | Ht 60.0 in | Wt 153.0 lb

## 2023-07-22 DIAGNOSIS — N8110 Cystocele, unspecified: Secondary | ICD-10-CM

## 2023-07-22 DIAGNOSIS — Z01419 Encounter for gynecological examination (general) (routine) without abnormal findings: Secondary | ICD-10-CM | POA: Diagnosis not present

## 2023-07-22 DIAGNOSIS — N3001 Acute cystitis with hematuria: Secondary | ICD-10-CM

## 2023-07-22 MED ORDER — INTRAROSA 6.5 MG VA INST: 1.0000 | VAGINAL_INSERT | Freq: Every evening | VAGINAL | 12 refills | Status: DC | PRN

## 2023-07-24 MED ORDER — INTRAROSA 6.5 MG VA INST: 1.0000 | VAGINAL_INSERT | Freq: Every evening | VAGINAL | 12 refills | Status: DC | PRN

## 2023-07-24 NOTE — Progress Notes (Signed)
 58 y.o. y.o. female here for annual exam. Patient's last menstrual period was 04/22/2005.    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 Married. Partner had a girlfriend during their marriage.  Does report decreased libido and vaginal dryness contributed to that. Mother died of pancreatic Ca in August 10, 2020.   RP:  Established patient presenting for annual gyn exam    HPI: Status post total hysterectomy Ovaries intact. History of anterior and posterior repair now with Stage 2-3 cystocele. Referral placed to urogyn.  No menopausal syndrome.  No pelvic pain.  Pap neg 07/09/22. Breasts normal. Mammo Neg 05/2023.  Bowel movements normal.  Body mass index 28.53.  Physically active at work but no fitness activities.  F/U here for Fasting Health labs. [place]ono 05/13/2020. Dxa; referral placed for baseline  Body mass index is 29.88 kg/m.     05/27/2023   10:37 AM 01/18/2023   11:06 AM 11/16/2022    8:39 AM  Depression screen PHQ 2/9  Decreased Interest 0 0 0  Down, Depressed, Hopeless 0 0 0  PHQ - 2 Score 0 0 0    Blood pressure 130/84, pulse 70, height 5' (1.524 m), weight 153 lb (69.4 kg), last menstrual period 04/22/2005, SpO2 99%.     Component Value Date/Time   DIAGPAP  07/09/2022 1603    - Negative for intraepithelial lesion or malignancy (NILM)   HPVHIGH Negative 07/09/2022 1603   ADEQPAP Satisfactory for evaluation. 07/09/2022 1603    GYN HISTORY:    Component Value Date/Time   DIAGPAP  07/09/2022 1603    - Negative for intraepithelial lesion or malignancy (NILM)   HPVHIGH Negative 07/09/2022 1603   ADEQPAP Satisfactory for evaluation. 07/09/2022 1603    OB History  Gravida Para Term Preterm AB Living  2 1 1  1 1   SAB IAB Ectopic Multiple Live Births  1    1    # Outcome Date GA Lbr Len/2nd Weight Sex Type Anes PTL Lv  2 SAB           1 Term     M Vag-Spont  N LIV    Past Medical History:  Diagnosis Date   Hiatal hernia    NSVD (normal spontaneous vaginal delivery)      Past Surgical History:  Procedure Laterality Date   ABDOMINAL HYSTERECTOMY     TVH/ A&P REPAIR   DILATION AND CURETTAGE OF UTERUS     x2    Current Outpatient Medications on File Prior to Visit  Medication Sig Dispense Refill   cholecalciferol (VITAMIN D3) 25 MCG (1000 UNIT) tablet Take 2,000 Units by mouth daily.     Magnesium 250 MG TABS Take 500 mg by mouth daily.     No current facility-administered medications on file prior to visit.    Social History   Socioeconomic History   Marital status: Married    Spouse name: Not on file   Number of children: 1   Years of education: Not on file   Highest education level: Not on file  Occupational History   Occupation: works at  [employer]  Tobacco Use   Smoking status: Never   Smokeless tobacco: Never  Vaping Use   Vaping status: Never Used  Substance and Sexual Activity   Alcohol use: No   Drug use: No   Sexual activity: Not Currently    Partners: Male    Birth control/protection: Surgical    Comment: 1st intercourse- 19, partners- 5, married- 21 yrs,  hysterectomy  Other Topics Concern   Not on file  Social History Narrative   Originally from [place].   Household: pt and husband   Lost mother Clary Boulais (my patient) to pancreatic cancer July 2022   Social Drivers of Health   Financial Resource Strain: Not on file  Food Insecurity: Not on file  Transportation Needs: Not on file  Physical Activity: Not on file  Stress: Not on file  Social Connections: Not on file  Intimate Partner Violence: Not on file    Family History  Problem Relation Age of Onset   Hypertension Mother    Pancreatic cancer Mother    Cancer Maternal Aunt 37       OVARIAN ( DESEASED)   Cancer Maternal Aunt 26       STOMACH (DESEASED)   Cancer Maternal Aunt        THYROID (DESEASED)   CAD Neg Hx    Breast cancer Neg Hx      Allergies  Allergen Reactions   Aspirin Anaphylaxis and Hives   Nsaids Anaphylaxis and Hives    Ciprofloxacin    Codeine Nausea And Vomiting   Hycodan [Hydrocodone Bit-Homatrop Mbr] Nausea And Vomiting    Pt called back 07/31/11 and reported that she was vomiting from the hycodan   Levaquin [Levofloxacin In D5w] Other (See Comments)    Joint pain, light headedness.      Patient's last menstrual period was Patient's last menstrual period was 04/22/2005.Marland Kitchen            Review of Systems Alls systems reviewed and are negative.     Physical Exam Constitutional:      Appearance: Normal appearance.  Genitourinary:     Vulva and urethral meatus normal.     No lesions in the vagina.     Right Labia: No rash, lesions or skin changes.    Left Labia: No lesions, skin changes or rash.    No vaginal discharge or tenderness.     Anterior vaginal prolapse present.    Mild vaginal atrophy present.     Right Adnexa: not tender, not palpable and no mass present.    Left Adnexa: not tender, not palpable and no mass present.    Cervix is absent.     No cervical discharge.     Uterus is absent.  Breasts:    Right: Normal.     Left: Normal.  HENT:     Head: Normocephalic.  Neck:     Thyroid: No thyroid mass, thyromegaly or thyroid tenderness.  Cardiovascular:     Rate and Rhythm: Normal rate and regular rhythm.     Heart sounds: Normal heart sounds, S1 normal and S2 normal.  Pulmonary:     Effort: Pulmonary effort is normal.     Breath sounds: Normal breath sounds and air entry.  Abdominal:     General: There is no distension.     Palpations: Abdomen is soft. There is no mass.     Tenderness: There is no abdominal tenderness. There is no guarding or rebound.  Musculoskeletal:        General: Normal range of motion.     Cervical back: Full passive range of motion without pain, normal range of motion and neck supple. No tenderness.     Right lower leg: No edema.     Left lower leg: No edema.  Neurological:     Mental Status: She is alert.  Skin:    General: Skin is  warm.   Psychiatric:        Mood and Affect: Mood normal.        Behavior: Behavior normal.        Thought Content: Thought content normal.  Vitals and nursing note reviewed. Exam conducted with a chaperone present.       A:         Well Woman GYN exam                             P:        Pap smear not indicated Encouraged annual mammogram screening Colon cancer screening up-to-date DXA ordered today Labs and immunizations to do with PMD Discussed breast self exams Encouraged healthy lifestyle practices Encouraged Vit D and Calcium   No follow-ups on file.  Earley Favor

## 2023-07-26 ENCOUNTER — Ambulatory Visit: Payer: BC Managed Care – PPO | Admitting: Obstetrics

## 2023-08-02 ENCOUNTER — Telehealth: Payer: Self-pay | Admitting: Obstetrics and Gynecology

## 2023-08-02 ENCOUNTER — Other Ambulatory Visit

## 2023-08-02 ENCOUNTER — Encounter: Payer: Self-pay | Admitting: Obstetrics and Gynecology

## 2023-08-02 DIAGNOSIS — Z0000 Encounter for general adult medical examination without abnormal findings: Secondary | ICD-10-CM

## 2023-08-02 DIAGNOSIS — R300 Dysuria: Secondary | ICD-10-CM

## 2023-08-02 DIAGNOSIS — Z01419 Encounter for gynecological examination (general) (routine) without abnormal findings: Secondary | ICD-10-CM

## 2023-08-02 DIAGNOSIS — E559 Vitamin D deficiency, unspecified: Secondary | ICD-10-CM | POA: Diagnosis not present

## 2023-08-02 DIAGNOSIS — R7303 Prediabetes: Secondary | ICD-10-CM | POA: Diagnosis not present

## 2023-08-02 DIAGNOSIS — E785 Hyperlipidemia, unspecified: Secondary | ICD-10-CM | POA: Diagnosis not present

## 2023-08-02 NOTE — Telephone Encounter (Signed)
 Frequency, dysuria

## 2023-08-02 NOTE — Telephone Encounter (Signed)
 Needs labs Dr. Karma Greaser

## 2023-08-03 LAB — COMPREHENSIVE METABOLIC PANEL
AG Ratio: 1.6 (calc) (ref 1.0–2.5)
ALT: 15 U/L (ref 6–29)
AST: 17 U/L (ref 10–35)
Albumin: 4.5 g/dL (ref 3.6–5.1)
Alkaline phosphatase (APISO): 73 U/L (ref 37–153)
BUN/Creatinine Ratio: 42 (calc) — ABNORMAL HIGH (ref 6–22)
BUN: 27 mg/dL — ABNORMAL HIGH (ref 7–25)
CO2: 25 mmol/L (ref 20–32)
Calcium: 9.5 mg/dL (ref 8.6–10.4)
Chloride: 106 mmol/L (ref 98–110)
Creat: 0.64 mg/dL (ref 0.50–1.03)
Globulin: 2.9 g/dL (ref 1.9–3.7)
Glucose, Bld: 95 mg/dL (ref 65–99)
Potassium: 4.7 mmol/L (ref 3.5–5.3)
Sodium: 138 mmol/L (ref 135–146)
Total Bilirubin: 0.5 mg/dL (ref 0.2–1.2)
Total Protein: 7.4 g/dL (ref 6.1–8.1)
eGFR: 102 mL/min/{1.73_m2} (ref >=60)

## 2023-08-03 LAB — HEMOGLOBIN A1C
Hgb A1c MFr Bld: 5.7 %{Hb} — ABNORMAL HIGH (ref <5.7)
Mean Plasma Glucose: 117 mg/dL
eAG (mmol/L): 6.5 mmol/L

## 2023-08-03 LAB — CBC
HCT: 37.8 % (ref 35.0–45.0)
Hemoglobin: 12.8 g/dL (ref 11.7–15.5)
MCH: 29.6 pg (ref 27.0–33.0)
MCHC: 33.9 g/dL (ref 32.0–36.0)
MCV: 87.5 fL (ref 80.0–100.0)
MPV: 9.9 fL (ref 7.5–12.5)
Platelets: 315 10*3/uL (ref 140–400)
RBC: 4.32 10*6/uL (ref 3.80–5.10)
RDW: 12 % (ref 11.0–15.0)
WBC: 7.3 10*3/uL (ref 3.8–10.8)

## 2023-08-03 LAB — URINE CULTURE
MICRO NUMBER:: 16231835
Result:: NO GROWTH
SPECIMEN QUALITY:: ADEQUATE

## 2023-08-03 LAB — LIPID PANEL
Cholesterol: 227 mg/dL — ABNORMAL HIGH (ref <200)
HDL: 59 mg/dL (ref >=50)
LDL Cholesterol (Calc): 148 mg/dL — ABNORMAL HIGH
Non-HDL Cholesterol (Calc): 168 mg/dL — ABNORMAL HIGH (ref <130)
Total CHOL/HDL Ratio: 3.8 (calc) (ref <5.0)
Triglycerides: 92 mg/dL (ref <150)

## 2023-08-03 LAB — VITAMIN D 25 HYDROXY (VIT D DEFICIENCY, FRACTURES): Vit D, 25-Hydroxy: 37 ng/mL (ref 30–100)

## 2023-08-05 ENCOUNTER — Encounter: Payer: Self-pay | Admitting: Obstetrics and Gynecology

## 2023-08-08 LAB — URINALYSIS
Bilirubin Urine: NEGATIVE
Glucose, UA: NEGATIVE
Ketones, ur: NEGATIVE
Nitrite: NEGATIVE
Protein, ur: NEGATIVE
Specific Gravity, Urine: 1.025 (ref 1.001–1.035)
pH: 5.5 (ref 5.0–8.0)

## 2023-08-08 LAB — URINE CULTURE

## 2023-08-31 ENCOUNTER — Other Ambulatory Visit: Payer: Self-pay

## 2023-08-31 ENCOUNTER — Emergency Department (HOSPITAL_COMMUNITY)

## 2023-08-31 ENCOUNTER — Encounter (HOSPITAL_COMMUNITY): Payer: Self-pay

## 2023-08-31 ENCOUNTER — Telehealth

## 2023-08-31 ENCOUNTER — Emergency Department (HOSPITAL_COMMUNITY)
Admission: EM | Admit: 2023-08-31 | Discharge: 2023-08-31 | Disposition: A | Discharge status: Home / Self Care | Attending: Emergency Medicine | Admitting: Emergency Medicine

## 2023-08-31 DIAGNOSIS — N281 Cyst of kidney, acquired: Secondary | ICD-10-CM | POA: Insufficient documentation

## 2023-08-31 DIAGNOSIS — R109 Unspecified abdominal pain: Secondary | ICD-10-CM | POA: Diagnosis not present

## 2023-08-31 DIAGNOSIS — K760 Fatty (change of) liver, not elsewhere classified: Secondary | ICD-10-CM | POA: Diagnosis not present

## 2023-08-31 DIAGNOSIS — R319 Hematuria, unspecified: Secondary | ICD-10-CM | POA: Diagnosis not present

## 2023-08-31 DIAGNOSIS — N3289 Other specified disorders of bladder: Secondary | ICD-10-CM | POA: Diagnosis not present

## 2023-08-31 DIAGNOSIS — R9431 Abnormal electrocardiogram [ECG] [EKG]: Secondary | ICD-10-CM | POA: Diagnosis not present

## 2023-08-31 DIAGNOSIS — R300 Dysuria: Secondary | ICD-10-CM

## 2023-08-31 DIAGNOSIS — K5730 Diverticulosis of large intestine without perforation or abscess without bleeding: Secondary | ICD-10-CM | POA: Diagnosis not present

## 2023-08-31 LAB — URINALYSIS, W/ REFLEX TO CULTURE (INFECTION SUSPECTED)
Bilirubin Urine: NEGATIVE
Glucose, UA: NEGATIVE mg/dL
Ketones, ur: NEGATIVE mg/dL
Leukocytes,Ua: NEGATIVE
Nitrite: NEGATIVE
Protein, ur: NEGATIVE mg/dL
Specific Gravity, Urine: 1.003 — ABNORMAL LOW (ref 1.005–1.030)
pH: 7 (ref 5.0–8.0)

## 2023-08-31 LAB — CBC WITH DIFFERENTIAL/PLATELET
Abs Immature Granulocytes: 0.04 10*3/uL (ref 0.00–0.07)
Basophils Absolute: 0.1 10*3/uL (ref 0.0–0.1)
Basophils Relative: 1 %
Eosinophils Absolute: 0.1 10*3/uL (ref 0.0–0.5)
Eosinophils Relative: 1 %
HCT: 40.4 % (ref 36.0–46.0)
Hemoglobin: 13.1 g/dL (ref 12.0–15.0)
Immature Granulocytes: 0 %
Lymphocytes Relative: 21 %
Lymphs Abs: 2 10*3/uL (ref 0.7–4.0)
MCH: 29.8 pg (ref 26.0–34.0)
MCHC: 32.4 g/dL (ref 30.0–36.0)
MCV: 91.8 fL (ref 80.0–100.0)
Monocytes Absolute: 0.6 10*3/uL (ref 0.1–1.0)
Monocytes Relative: 6 %
Neutro Abs: 6.4 10*3/uL (ref 1.7–7.7)
Neutrophils Relative %: 71 %
Platelets: 299 10*3/uL (ref 150–400)
RBC: 4.4 MIL/uL (ref 3.87–5.11)
RDW: 11.8 % (ref 11.5–15.5)
WBC: 9.2 10*3/uL (ref 4.0–10.5)
nRBC: 0 % (ref 0.0–0.2)

## 2023-08-31 LAB — COMPREHENSIVE METABOLIC PANEL WITH GFR
ALT: 17 U/L (ref 0–44)
AST: 19 U/L (ref 15–41)
Albumin: 4 g/dL (ref 3.5–5.0)
Alkaline Phosphatase: 65 U/L (ref 38–126)
Anion gap: 9 (ref 5–15)
BUN: 19 mg/dL (ref 6–20)
CO2: 24 mmol/L (ref 22–32)
Calcium: 9.3 mg/dL (ref 8.9–10.3)
Chloride: 105 mmol/L (ref 98–111)
Creatinine, Ser: 0.68 mg/dL (ref 0.44–1.00)
GFR, Estimated: >60 mL/min (ref >60)
Glucose, Bld: 106 mg/dL — ABNORMAL HIGH (ref 70–99)
Potassium: 3.9 mmol/L (ref 3.5–5.1)
Sodium: 138 mmol/L (ref 135–145)
Total Bilirubin: 0.5 mg/dL (ref 0.0–1.2)
Total Protein: 7.4 g/dL (ref 6.5–8.1)

## 2023-08-31 LAB — D-DIMER, QUANTITATIVE: D-Dimer, Quant: 0.55 ug{FEU}/mL — ABNORMAL HIGH (ref 0.00–0.50)

## 2023-08-31 LAB — LIPASE, BLOOD: Lipase: 35 U/L (ref 11–51)

## 2023-08-31 MED ORDER — ACETAMINOPHEN 325 MG PO TABS
650.0000 mg | ORAL_TABLET | Freq: Once | ORAL | Status: AC
  Administered 2023-08-31: 650 mg via ORAL
  Filled 2023-08-31: qty 2

## 2023-08-31 MED ORDER — CEPHALEXIN 500 MG PO CAPS: 500.0000 mg | ORAL_CAPSULE | Freq: Four times a day (QID) | ORAL | 0 refills | Status: DC

## 2023-08-31 MED ORDER — METHOCARBAMOL 500 MG PO TABS: 500.0000 mg | ORAL_TABLET | Freq: Two times a day (BID) | ORAL | 0 refills | Status: DC

## 2023-08-31 NOTE — Discharge Instructions (Addendum)
 It was a pleasure taking care of you here in the emergency department today  We have sent your urine for culture.  You are started on antibiotics given you are leaving.  Make sure to follow-up outpatient with your primary care provider.  I suspect your pain is likely musculoskeletal we will discharge you on muscle relaxers.  As discussed in the room you have a cyst on your left kidney please follow-up with urologist for this  Make sure to follow-up outpatient, return for any worsening symptoms

## 2023-08-31 NOTE — ED Provider Notes (Signed)
 [place] EMERGENCY DEPARTMENT AT [facility] Provider Note   CSN: [id number] Arrival date & time: 08/31/23  1610    History  Chief Complaint  Patient presents with   Flank Pain    Cheryl Booth is a 58 y.o. female here for right flank pain.  Started yesterday, increased today.  No history of similar.  No history of kidney stones.  Pain is constant, does not radiate.  8 out of 10.  Worse with deep breathing, movement.  No fever, nausea, vomiting, chest pain, shortness of breath, diarrhea.  No history of PE or DVT.  Taking Tylenol .  No recent falls or injury.  Leaving for vacation concerned about UTI and infection kidney  Last Tylenol  500 mg 4 hours PTA     HPI     Home Medications Prior to Admission medications   Medication Sig Start Date End Date Taking? Authorizing Provider  cephALEXin  (KEFLEX ) 500 MG capsule Take 1 capsule (500 mg total) by mouth 4 (four) times daily. 08/31/23  Yes Dohn Stclair A, PA-C  methocarbamol  (ROBAXIN ) 500 MG tablet Take 1 tablet (500 mg total) by mouth 2 (two) times daily. 08/31/23  Yes Jenette Rayson A, PA-C  cholecalciferol (VITAMIN D3) 25 MCG (1000 UNIT) tablet Take 2,000 Units by mouth daily.    [provider]  Magnesium 250 MG TABS Take 500 mg by mouth daily.    [provider]  Prasterone  (INTRAROSA ) 6.5 MG INST Place 1 suppository vaginally at bedtime as needed. 07/24/23   Reinaldo Caras, MD      Allergies    Aspirin, Nsaids, Ciprofloxacin , Codeine, Hycodan [hydrocodone  bit-homatrop mbr], and Levaquin  [levofloxacin  in d5w]    Review of Systems   Review of Systems  Constitutional: Negative.   HENT: Negative.    Respiratory: Negative.    Cardiovascular: Negative.   Gastrointestinal: Negative.   Genitourinary:  Positive for flank pain. Negative for decreased urine volume, difficulty urinating, dysuria, frequency, hematuria, pelvic pain and urgency.  Skin: Negative.   Neurological: Negative.   All  other systems reviewed and are negative.   Physical Exam Updated Vital Signs BP 119/66   Pulse (!) 58   Temp 98.7 F (37.1 C)   Resp 18   Ht 5' (1.524 m)   Wt 69.4 kg   LMP 04/22/2005   SpO2 95%   BMI 29.88 kg/m  Physical Exam Vitals and nursing note reviewed.  Constitutional:      General: She is not in acute distress.    Appearance: She is well-developed. She is not ill-appearing, toxic-appearing or diaphoretic.  HENT:     Head: Atraumatic.  Eyes:     Pupils: Pupils are equal, round, and reactive to light.  Cardiovascular:     Rate and Rhythm: Normal rate.     Pulses: Normal pulses.          Radial pulses are 2+ on the right side and 2+ on the left side.       Dorsalis pedis pulses are 2+ on the right side and 2+ on the left side.     Heart sounds: Normal heart sounds.  Pulmonary:     Effort: Pulmonary effort is normal. No respiratory distress.     Breath sounds: Normal breath sounds.  Abdominal:     General: Bowel sounds are normal. There is no distension.     Palpations: Abdomen is soft.     Tenderness: There is no abdominal tenderness. There is no right CVA tenderness,  left CVA tenderness or guarding.     Comments: Soft, nontender, negative Murphy sign.  Mild tenderness about right flank however negative tap.  No overlying skin changes  Musculoskeletal:        General: Normal range of motion.     Cervical back: Normal range of motion.     Comments: No bony tenderness, compartments soft, full range of motion.  No lower extremity edema no midline tenderness  Skin:    General: Skin is warm and dry.     Capillary Refill: Capillary refill takes less than 2 seconds.     Comments: No obvious rashes or lesions to exposed skin  Neurological:     General: No focal deficit present.     Mental Status: She is alert.  Psychiatric:        Mood and Affect: Mood normal.     ED Results / Procedures / Treatments   Labs (all labs ordered are listed, but only abnormal results  are displayed) Labs Reviewed  URINALYSIS, W/ REFLEX TO CULTURE (INFECTION SUSPECTED) - Abnormal; Notable for the following components:      Result Value   Color, Urine COLORLESS (*)    Specific Gravity, Urine 1.003 (*)    Hgb urine dipstick MODERATE (*)    Bacteria, UA RARE (*)    All other components within normal limits  COMPREHENSIVE METABOLIC PANEL WITH GFR - Abnormal; Notable for the following components:   Glucose, Bld 106 (*)    All other components within normal limits  D-DIMER, QUANTITATIVE - Abnormal; Notable for the following components:   D-Dimer, Quant 0.55 (*)    All other components within normal limits  URINE CULTURE  CBC WITH DIFFERENTIAL/PLATELET  LIPASE, BLOOD    EKG EKG Interpretation Date/Time:  Saturday August 31 2023 18:30:02 EDT Ventricular Rate:  70 PR Interval:  132 QRS Duration:  85 QT Interval:  409 QTC Calculation: 442 R Axis:   51  Text Interpretation: Sinus rhythm Low voltage, precordial leads No significant change since last tracing Confirmed by Trish Furl [phone]) on 08/31/2023 6:34:02 PM  Radiology CT Renal Stone Study Result Date: 08/31/2023 CLINICAL DATA:  Right flank pain. EXAM: CT ABDOMEN AND PELVIS WITHOUT CONTRAST TECHNIQUE: Multidetector CT imaging of the abdomen and pelvis was performed following the standard protocol without IV contrast. RADIATION DOSE REDUCTION: This exam was performed according to the departmental dose-optimization program which includes automated exposure control, adjustment of the mA and/or kV according to patient size and/or use of iterative reconstruction technique. COMPARISON:  June 19, 2022 FINDINGS: Lower chest: No acute abnormality. Hepatobiliary: There is mild diffuse fatty infiltration of the liver. No focal liver abnormality is seen. No gallstones, gallbladder wall thickening, or biliary dilatation. Pancreas: Unremarkable. No pancreatic ductal dilatation or surrounding inflammatory changes. Spleen: Normal in  size without focal abnormality. Adrenals/Urinary Tract: Adrenal glands are unremarkable. Kidneys are normal in size, without renal calculi or hydronephrosis. A 9 mm mildly hyperdense focus is seen within the mid left kidney. The urinary bladder is poorly distended and subsequently limited in evaluation. Stomach/Bowel: Stomach is within normal limits. Appendix appears normal. No evidence of bowel wall thickening, distention, or inflammatory changes. Numerous noninflamed diverticula are seen throughout the large bowel. Vascular/Lymphatic: No significant vascular findings are present. No enlarged abdominal or pelvic lymph nodes. Reproductive: Status post hysterectomy. No adnexal masses. Other: No abdominal wall hernia or abnormality. No abdominopelvic ascites. Musculoskeletal: No acute or significant osseous findings. IMPRESSION: 1. Colonic diverticulosis. 2. Findings which may represent  a small hemorrhagic left renal cyst. Correlation with nonemergent renal ultrasound is recommended. Electronically Signed   By: Virgle Grime M.D.   On: 08/31/2023 19:41    Procedures Procedures    Medications Ordered in ED Medications  acetaminophen  (TYLENOL ) tablet 650 mg (650 mg Oral Given 08/31/23 1853)   ED Course/ Medical Decision Making/ A&P    58 year old here for evaluation of right flank pain.  Started yesterday.  Also dysuria.  States she has been having dysuria symptoms seen by gynecologist who did a urine culture which did not grow anything.  Pain worse with movement, deep breathing.  She was concerned about kidney stones and she is going to be leaving on vacation and wanted to see Cara.  Pain not worse with food intake.  No nausea or vomiting.  Has some tenderness to right flank.  Will plan on labs and imaging does not want anything additional for pain at this time.  Labs and imaging personally viewed interpreted CBC without leukocytosis Metabolic panel without significant abnormality Lipase  35 D-dimer 0.55 age-adjusted within normal limits UA with blood, appears to have chronic hematuria, rare bacteria, WBC 0-5--sent for culture per patient request  Patient reassessed.  I suspect her pain is likely musculoskeletal in etiology given worse with movement.  She is very concerned that she is leaving on vacation tomorrow and will not have antibiotics if her urine culture results.  I discussed we will send for culture.  Did write for some antibiotics while she is gone in case she develops a fever, worsening symptoms and urinalysis is positive for infection.  Will have her follow-up outpatient.  Did note incidental left renal cyst.  Will have her follow-up with her urologist for further outpatient workup.  On repeat exam patient does not have a surgical abdomin and there are no peritoneal signs.  No indication of appendicitis, bowel obstruction, bowel perforation, cholecystitis, diverticulitis, PID, intermittent/persistent torsion, TOA, PE, PNA, pneumothorax or ectopic pregnancy.  Patient discharged home with symptomatic treatment and given strict instructions for follow-up with their primary care physician.  I have also discussed reasons to return immediately to the ER.  Patient expresses understanding and agrees with plan.                                  Medical Decision Making Amount and/or Complexity of Data Reviewed Independent Historian: friend External Data Reviewed: labs, radiology, ECG and notes. Labs: ordered. Decision-making details documented in ED Course. Radiology: ordered and independent interpretation performed. Decision-making details documented in ED Course. ECG/medicine tests: ordered and independent interpretation performed. Decision-making details documented in ED Course.  Risk OTC drugs. Prescription drug management. Parenteral controlled substances. Decision regarding hospitalization. Diagnosis or treatment significantly limited by social determinants of  health.          Final Clinical Impression(s) / ED Diagnoses Final diagnoses:  Flank pain  Dysuria  Hematuria, unspecified type  Renal cyst    Rx / DC Orders ED Discharge Orders          Ordered    methocarbamol  (ROBAXIN ) 500 MG tablet  2 times daily        08/31/23 2025    cephALEXin  (KEFLEX ) 500 MG capsule  4 times daily        08/31/23 2025              Victora Irby A, PA-C 08/31/23 2051    Trish Furl,  MD 09/01/23 1505

## 2023-08-31 NOTE — ED Triage Notes (Signed)
 Right lower back pain radiating to lower back pain, denies dysuria, fever, N/V onset last night. Patient appears awake, alert and oriented. Breathing even and unlabored. Urine cup provided to patient. Instructed to stay NPO until further orders.

## 2023-09-02 LAB — URINE CULTURE

## 2023-09-03 NOTE — Telephone Encounter (Signed)
 Dr. Karma Greaser -please review and advise

## 2023-09-03 NOTE — Telephone Encounter (Signed)
 Patient states she went to the ER over the weekend & they checked her urine. They gave her Keflex  to take. She said she doesn't need us  to send any medications for her right now.

## 2023-09-09 ENCOUNTER — Ambulatory Visit: Admitting: Internal Medicine

## 2023-10-21 ENCOUNTER — Ambulatory Visit: Payer: BC Managed Care – PPO | Admitting: Obstetrics and Gynecology

## 2023-10-21 ENCOUNTER — Other Ambulatory Visit (HOSPITAL_COMMUNITY)
Admission: RE | Admit: 2023-10-21 | Discharge: 2023-10-21 | Disposition: A | Discharge status: Home / Self Care | Source: Other Acute Inpatient Hospital | Attending: Obstetrics and Gynecology | Admitting: Obstetrics and Gynecology

## 2023-10-21 ENCOUNTER — Encounter: Payer: Self-pay | Admitting: Obstetrics and Gynecology

## 2023-10-21 VITALS — BP 115/68 | Ht 59.5 in | Wt 155.2 lb

## 2023-10-21 DIAGNOSIS — R82998 Other abnormal findings in urine: Secondary | ICD-10-CM | POA: Diagnosis not present

## 2023-10-21 DIAGNOSIS — R350 Frequency of micturition: Secondary | ICD-10-CM | POA: Diagnosis not present

## 2023-10-21 DIAGNOSIS — R319 Hematuria, unspecified: Secondary | ICD-10-CM

## 2023-10-21 DIAGNOSIS — N993 Prolapse of vaginal vault after hysterectomy: Secondary | ICD-10-CM

## 2023-10-21 LAB — POCT URINALYSIS DIP (CLINITEK)
Bilirubin, UA: NEGATIVE
Glucose, UA: NEGATIVE mg/dL
Ketones, POC UA: NEGATIVE mg/dL
Nitrite, UA: NEGATIVE
POC PROTEIN,UA: NEGATIVE
Spec Grav, UA: 1.015 (ref 1.010–1.025)
Urobilinogen, UA: 0.2 U/dL
pH, UA: 7 (ref 5.0–8.0)

## 2023-10-21 NOTE — Progress Notes (Signed)
 New Patient Evaluation and Consultation  Referring Provider: Reinaldo Caras, MD PCP: Ezell Hollow, MD Date of Service: 10/21/2023  SUBJECTIVE Chief Complaint: New Patient (Initial Visit) Cheryl Booth is a 58 y.o. female is here for Hx Done // Pelvic pain & Cystocele,)  History of Present Illness: Cheryl Booth is a 58 y.o. White or Caucasian female seen in consultation at the request of Dr Tia Flowers for evaluation of prolapse.    Review of records significant for: Has history of TVH with A&P repair in 2006 now with recurrence.   Urinary Symptoms: Does not leak urine.   Day time voids 4.  Nocturia: 1 times per night to void. Voiding dysfunction:  empties bladder well.  Patient does not use a catheter to empty bladder.  When urinating, patient feels she has no difficulties  UTIs: 1 UTI's in the last year.   Denies history of blood in urine and kidney or bladder stones   Pelvic Organ Prolapse Symptoms:                  Patient Admits to a feeling of a bulge the vaginal area. It has been present for a few years.  Patient Denies seeing a bulge.  This bulge is bothersome. She used a pessary ring for some time for 3 years. Had surgery for prolapse in 2006- this worked for a few years.   Bowel Symptom: Bowel movements: 1 time(s) per day Stool consistency: soft  Straining: no.  Splinting: no.  Incomplete evacuation: no.  Patient Denies accidental bowel leakage / fecal incontinence Bowel regimen: magnesium  [facility] Colonoscopy          Upcoming     Colonoscopy (Every 10 Years) Next due on 05/13/2030    05/13/2020  [facility] COLONOSCOPY   Only the first 1 history entries have been loaded, but more history exists.                Sexual Function Sexually active: no.  Sexual orientation: Straight Pain with sex: No  Pelvic Pain Denies pelvic pain    Past Medical History:  Past Medical History:  Diagnosis Date   Hiatal hernia    NSVD (normal spontaneous vaginal  delivery)      Past Surgical History:   Past Surgical History:  Procedure Laterality Date   DILATION AND CURETTAGE OF UTERUS     x2   TOTAL VAGINAL HYSTERECTOMY     VAGINAL PROLAPSE REPAIR     anterior and posterior repair     Past OB/GYN History: OB History  Gravida Para Term Preterm AB Living  2 1 1  1 1   SAB IAB Ectopic Multiple Live Births  1    1    # Outcome Date GA Lbr Len/2nd Weight Sex Type Anes PTL Lv  2 SAB           1 Term     M Vag-Spont  N LIV   S/p hysterectomy    Component Value Date/Time   DIAGPAP  07/09/2022 1603    - Negative for intraepithelial lesion or malignancy (NILM)   HPVHIGH Negative 07/09/2022 1603   ADEQPAP Satisfactory for evaluation. 07/09/2022 1603    Medications: Patient has a current medication list which includes the following prescription(s): cholecalciferol and magnesium.   Allergies: Patient is allergic to aspirin, nsaids, ciprofloxacin , codeine, hycodan [hydrocodone  bit-homatrop mbr], and levaquin  [levofloxacin  in d5w].   Social History:  Social History   Tobacco Use   Smoking status:  Never   Smokeless tobacco: Never  Vaping Use   Vaping status: Never Used  Substance Use Topics   Alcohol use: No   Drug use: No    Relationship status: married Patient lives with husband and son.   Patient is employedBarrister's clerk. Regular exercise: Yes: walking History of abuse: No  Family History:   Family History  Problem Relation Age of Onset   Hypertension Mother    Pancreatic cancer Mother    Cancer Maternal Aunt 51       OVARIAN ( DESEASED)   Cancer Maternal Aunt 60       STOMACH (DESEASED)   Cancer Maternal Aunt        THYROID  (DESEASED)   CAD Neg Hx    Breast cancer Neg Hx      Review of Systems: Review of Systems  Constitutional:  Negative for fever, malaise/fatigue and weight loss.  Respiratory:  Negative for cough, shortness of breath and wheezing.   Cardiovascular:  Negative for chest pain, palpitations and leg  swelling.  Gastrointestinal:  Negative for abdominal pain and blood in stool.  Genitourinary:  Negative for dysuria.  Musculoskeletal:  Negative for myalgias.  Skin:  Negative for rash.  Neurological:  Negative for dizziness and headaches.  Endo/Heme/Allergies:  Does not bruise/bleed easily.  Psychiatric/Behavioral:  Negative for depression. The patient is not nervous/anxious.      OBJECTIVE Physical Exam: Vitals:   10/21/23 1021  BP: 115/68  Weight: 155 lb 3.2 oz (70.4 kg)  Height: 4' 11.5" (1.511 m)    Physical Exam Vitals reviewed. Exam conducted with a chaperone present.  Constitutional:      General: She is not in acute distress. Pulmonary:     Effort: Pulmonary effort is normal.  Abdominal:     General: There is no distension.     Palpations: Abdomen is soft.     Tenderness: There is no abdominal tenderness. There is no rebound.  Musculoskeletal:        General: No swelling. Normal range of motion.  Skin:    General: Skin is warm and dry.     Findings: No rash.  Neurological:     Mental Status: She is alert and oriented to person, place, and time.  Psychiatric:        Mood and Affect: Mood normal.        Behavior: Behavior normal.      GU / Detailed Urogynecologic Evaluation:  Pelvic Exam: Normal external female genitalia; Bartholin's and Skene's glands normal in appearance; urethral meatus normal in appearance, no urethral masses or discharge.   CST: negative  s/p hysterectomy: Speculum exam reveals normal vaginal mucosa with  atrophy and normal vaginal cuff.  Adnexa no mass, fullness, tenderness.     Pelvic floor strength II/V, puborectalis IV/V external anal sphincter IV/V  Pelvic floor musculature: Right levator non-tender, Right obturator non-tender, Left levator non-tender, Left obturator non-tender  POP-Q:   POP-Q  1                                            Aa   1                                           Ba  -4  C   3                                            Gh  5                                            Pb  6.5                                            tvl   -2.5                                            Ap  -2.5                                            Bp                                                 D      Rectal Exam:  Normal sphincter tone, no distal rectocele, enterocoele not present, no rectal masses, no sign of dyssynergia when asking the patient to bear down.  Post-Void Residual (PVR) by Bladder Scan: In order to evaluate bladder emptying, we discussed obtaining a postvoid residual and patient agreed to this procedure.  Procedure: The ultrasound unit was placed on the patient's abdomen in the suprapubic region after the patient had voided.    Post Void Residual - 10/21/23 1044       Post Void Residual   Post Void Residual 98 mL              Laboratory Results: Lab Results  Component Value Date   COLORU yellow 10/21/2023   CLARITYU clear 10/21/2023   GLUCOSEUR negative 10/21/2023   BILIRUBINUR negative 10/21/2023   SPECGRAV 1.015 10/21/2023   RBCUR small (A) 10/21/2023   PHUR 7.0 10/21/2023   PROTEINUR NEGATIVE 08/31/2023   UROBILINOGEN 0.2 10/21/2023   LEUKOCYTESUR Trace (A) 10/21/2023    Lab Results  Component Value Date   CREATININE 0.68 08/31/2023   CREATININE 0.64 08/02/2023   CREATININE 0.63 07/10/2022    Lab Results  Component Value Date   HGBA1C 5.7 (H) 08/02/2023    Lab Results  Component Value Date   HGB 13.1 08/31/2023     ASSESSMENT AND PLAN Ms. Carlo is a 58 y.o. with:  1. Vaginal vault prolapse after hysterectomy   2. Urinary frequency   3. Hematuria, unspecified type   4. Urine leukocytes     Stage II anterior, Stage I posterior, Stage I apical prolapse - For treatment of pelvic organ prolapse, we discussed options for management including expectant management, conservative management, and  surgical management, such as Kegels, a pessary, pelvic floor physical therapy, and specific surgical procedures. - We discussed two options for  prolapse repair:  1) vaginal repair without mesh - Pros - safer, no mesh complications - Cons - not as strong as mesh repair, higher risk of recurrence  2) laparoscopic repair with mesh - Pros - stronger, better long-term success - Cons - risks of mesh implant (erosion into vagina or bladder, adhering to the rectum, pain) - these risks are lower than with a vaginal mesh but still exist - Plan for surgery: Exam under anesthesia, robotic sacroclpopexy, cystoscopy, possible midurethral sling - Will have her return for simple CMG to look for occult incontinence and for pre op  2. Blood in urine and leukocytes - will send urine for micro UA and culture   Arma Lamp, MD

## 2023-10-22 LAB — URINE CULTURE

## 2023-10-30 ENCOUNTER — Encounter (HOSPITAL_BASED_OUTPATIENT_CLINIC_OR_DEPARTMENT_OTHER): Payer: Self-pay | Admitting: *Deleted

## 2023-11-18 ENCOUNTER — Encounter: Payer: Self-pay | Admitting: Internal Medicine

## 2023-11-18 ENCOUNTER — Ambulatory Visit (INDEPENDENT_AMBULATORY_CARE_PROVIDER_SITE_OTHER): Payer: BC Managed Care – PPO | Admitting: Internal Medicine

## 2023-11-18 ENCOUNTER — Encounter: Payer: Self-pay | Admitting: Obstetrics and Gynecology

## 2023-11-18 VITALS — BP 122/82 | HR 65 | Temp 97.9°F | Resp 16 | Ht 59.5 in | Wt 156.0 lb

## 2023-11-18 DIAGNOSIS — Z2821 Immunization not carried out because of patient refusal: Secondary | ICD-10-CM | POA: Diagnosis not present

## 2023-11-18 DIAGNOSIS — Z0000 Encounter for general adult medical examination without abnormal findings: Secondary | ICD-10-CM

## 2023-11-18 DIAGNOSIS — R739 Hyperglycemia, unspecified: Secondary | ICD-10-CM | POA: Diagnosis not present

## 2023-11-18 DIAGNOSIS — E559 Vitamin D deficiency, unspecified: Secondary | ICD-10-CM | POA: Diagnosis not present

## 2023-11-18 DIAGNOSIS — N281 Cyst of kidney, acquired: Secondary | ICD-10-CM

## 2023-11-18 LAB — LIPID PANEL
Cholesterol: 243 mg/dL — ABNORMAL HIGH (ref 0–200)
HDL: 55.3 mg/dL (ref >39.00)
LDL Cholesterol: 139 mg/dL — ABNORMAL HIGH (ref 0–99)
NonHDL: 187.82
Total CHOL/HDL Ratio: 4
Triglycerides: 246 mg/dL — ABNORMAL HIGH (ref 0.0–149.0)
VLDL: 49.2 mg/dL — ABNORMAL HIGH (ref 0.0–40.0)

## 2023-11-18 LAB — HEMOGLOBIN A1C: Hgb A1c MFr Bld: 5.9 % (ref 4.6–6.5)

## 2023-11-18 LAB — VITAMIN D 25 HYDROXY (VIT D DEFICIENCY, FRACTURES): VITD: 39.18 ng/mL (ref 30.00–100.00)

## 2023-11-18 NOTE — Patient Instructions (Signed)
 Vaccines are recommended: Flu shot every fall Tetanus shot Shingrix COVID booster.    GO TO THE LAB :  Get the blood work   Your results will be posted on MyChart with my comments  Next office visit for a physical exam in 1 year Please make an appointment before you leave today

## 2023-11-18 NOTE — Progress Notes (Signed)
   Subjective:    Patient ID: Cheryl Booth, female    DOB: 04/26/1966, 58 y.o.   MRN: [id number]  DOS:  11/18/2023 Type of visit - description: CPX  Here for CPX. Has no concerns. Denies chest pain or difficulty breathing. No nausea vomiting diarrhea. No LUTS  Wt Readings from Last 3 Encounters:  11/18/23 156 lb (70.8 kg)  10/21/23 155 lb 3.2 oz (70.4 kg)  08/31/23 153 lb (69.4 kg)     Review of Systems   A 14 point review of systems is negative     Past Medical History:  Diagnosis Date   Hiatal hernia    NSVD (normal spontaneous vaginal delivery)     Past Surgical History:  Procedure Laterality Date   DILATION AND CURETTAGE OF UTERUS     x2   TOTAL VAGINAL HYSTERECTOMY     VAGINAL PROLAPSE REPAIR     anterior and posterior repair   Social History   Social History Narrative   Originally from [place].   Household: pt and husband   Lost mother Cheryl Booth (my patient) to pancreatic cancer July 2022     Current Outpatient Medications  Medication Instructions   cholecalciferol (VITAMIN D3) 2,000 Units, Daily   Magnesium 500 mg, Daily       Objective:   Physical Exam BP 122/82   Pulse 65   Temp 97.9 F (36.6 C) (Oral)   Resp 16   Ht 4' 11.5 (1.511 m)   Wt 156 lb (70.8 kg)   LMP 04/22/2005   SpO2 97%   BMI 30.98 kg/m  General: Well developed, NAD, BMI noted Neck: No  thyromegaly  HEENT:  Normocephalic . Face symmetric, atraumatic Lungs:  CTA B Normal respiratory effort, no intercostal retractions, no accessory muscle use. Heart: RRR,  no murmur.  Abdomen:  Not distended, soft, non-tender. No rebound or rigidity.   Lower extremities: no pretibial edema bilaterally  Skin: Exposed areas without rash. Not pale. Not jaundice Neurologic:  alert & oriented X3.  Speech normal, gait appropriate for age and unassisted Strength symmetric and appropriate for age.  Psych: Cognition and judgment appear intact.  Cooperative with normal attention span and  concentration.  Behavior appropriate. No anxious or depressed appearing.     Assessment     Assessment (transfer to me 05/26/2021) A1C 5.8 Obesity Hysterectomy (TVH) LFTs elevation Vitamin D  deficiency  PLAN Here for CPX Vaccine advise: Recommend Tdap, Shingrix, flu shot every fall, COVID booster is an option. -Female care: per gyn;  MMG 05/2023 -[facility] -[facility]: Colonoscopy 05/13/2020, next 10 years per report. - Bones: No previous DEXA, s/p hysterectomy years ago, no oophorectomy, she thinks gynecology plans to check a DEXA this year. - Labs : Reviewed, will get FLP A1c and vitamin D . -Active at work most days, recommend to start tracking steps and aim for 10,000 steps a day.  Encouraged portion control  Other issues: Vitamin D  deficiency: On supplements, checking levels. Vaginal vault prolapse: Saw urogynecology 10/21/2023.  Was offered surgery.  Patient inclined to proceed with conservative treatment  ER visit 08/31/2023, R flank pain, creatinine normal, CT renal stone: Possible small hemorrhagic L renal cyst.  At the end pain was felt to be MSK.  Symptoms resolved. Left renal cyst, radiology recommended a US , will arrange RTC 1 year

## 2023-11-18 NOTE — Assessment & Plan Note (Signed)
 Here for CPX Vaccine advise: Recommend Tdap, Shingrix, flu shot every fall, COVID booster is an option. -Female care: per gyn;  MMG 05/2023 -[facility] -CCS: Colonoscopy 05/13/2020, next 10 years per report. - Bones: No previous DEXA, s/p hysterectomy years ago, no oophorectomy, she thinks gynecology plans to check a DEXA this year. - Labs : Reviewed, will get FLP A1c and vitamin D . -Active at work most days, recommend to start tracking steps and aim for 10,000 steps a day.  Encouraged portion control

## 2023-11-18 NOTE — Assessment & Plan Note (Signed)
 Here for CPX   Other issues: Vitamin D  deficiency: On supplements, checking levels. Vaginal vault prolapse: Saw urogynecology 10/21/2023.  Was offered surgery.  Patient inclined to proceed with conservative treatment  ER visit 08/31/2023, R flank pain, creatinine normal, CT renal stone: Possible small hemorrhagic L renal cyst.  At the end pain was felt to be MSK.  Symptoms resolved. Left renal cyst, radiology recommended a US , will arrange RTC 1 year

## 2023-11-19 ENCOUNTER — Ambulatory Visit
Admission: RE | Admit: 2023-11-19 | Discharge: 2023-11-19 | Disposition: A | Discharge status: Home / Self Care | Source: Ambulatory Visit | Attending: Internal Medicine | Admitting: Internal Medicine

## 2023-11-20 ENCOUNTER — Ambulatory Visit: Payer: Self-pay | Admitting: Internal Medicine

## 2023-11-21 ENCOUNTER — Ambulatory Visit: Admitting: Obstetrics and Gynecology

## 2023-11-21 ENCOUNTER — Encounter: Payer: Self-pay | Admitting: Obstetrics and Gynecology

## 2023-11-21 VITALS — BP 120/74 | HR 60

## 2023-11-21 DIAGNOSIS — Z466 Encounter for fitting and adjustment of urinary device: Secondary | ICD-10-CM

## 2023-11-21 DIAGNOSIS — N952 Postmenopausal atrophic vaginitis: Secondary | ICD-10-CM | POA: Diagnosis not present

## 2023-11-21 DIAGNOSIS — N993 Prolapse of vaginal vault after hysterectomy: Secondary | ICD-10-CM | POA: Diagnosis not present

## 2023-11-21 MED ORDER — ESTRADIOL 0.1 MG/GM VA CREA: TOPICAL_CREAM | VAGINAL | 11 refills | Status: AC

## 2023-11-21 NOTE — Progress Notes (Signed)
 [facility] Return Visit  SUBJECTIVE  History of Present Illness: Cheryl Booth is a 58 y.o. female seen in follow-up for prolapse. She initially was interested in proceeding with surgery but has since changed her mind. Had used a pessary in the past prior to her hysterectomy and would like to use one again.    Past Medical History: Patient  has a past medical history of Hiatal hernia and NSVD (normal spontaneous vaginal delivery).   Past Surgical History: She  has a past surgical history that includes Dilation and curettage of uterus; Total vaginal hysterectomy; and Vaginal prolapse repair.   Medications: She has a current medication list which includes the following prescription(s): cholecalciferol, estradiol , and magnesium.   Allergies: Patient is allergic to aspirin, nsaids, ciprofloxacin , codeine, hycodan [hydrocodone  bit-homatrop mbr], and levaquin  [levofloxacin  in d5w].   Social History: Patient  reports that she has never smoked. She has never used smokeless tobacco. She reports that she does not drink alcohol and does not use drugs.     OBJECTIVE     Physical Exam: Vitals:   11/21/23 0842  BP: 120/74  Pulse: 60   Gen: No apparent distress, A&O x 3.  Detailed Urogynecologic Evaluation:  A #3 and #2 RWS pessary was placed. These were uncomfortable and removed. #1 RWS pessary placed. It was comfortable, fit well, and stayed in placed with strong cough, valsalva and bending.  Lot # [id number] Exp 09/12/25   Prior exam showed:  POP-Q   1                                            Aa   1                                           Ba   -4                                              C    3                                            Gh   5                                            Pb   6.5                                            tvl    -2.5                                            Ap   -2.5  Bp                                                   D            ASSESSMENT AND PLAN    Cheryl Booth is a 58 y.o. with:  1. Vaginal vault prolapse after hysterectomy   2. Vaginal atrophy     - #1 RWS pessary placed today. She will keep it in place until next visit but is interested in managing it herself as she did previously.  - Recommend vaginal estrogen cream to help with atrophy and discomfort with placement. Use nightly for two weeks then twice a week after.   Return 2-3 weeks for pessary check  Rosaline LOISE Caper, MD

## 2023-11-21 NOTE — Patient Instructions (Signed)
 You were fit with a #1 ring ring pessary. You are electing to leave it in place until your follow up visit. If it falls out you can try to put it back in or you can leave it out. If it seems to be slipping down you can use your finger to push it back in deeper - the deeper it is, the better fit.   Start vaginal estrogen therapy nightly for two weeks then 2 times weekly at night for treatment of vaginal atrophy (dryness of the vaginal tissues).  Please let us  know if the prescription is too expensive and we can look for alternative options.

## 2023-12-11 ENCOUNTER — Encounter: Payer: Self-pay | Admitting: *Deleted

## 2023-12-12 ENCOUNTER — Ambulatory Visit: Admitting: Obstetrics and Gynecology

## 2023-12-16 ENCOUNTER — Ambulatory Visit: Admitting: Obstetrics and Gynecology

## 2023-12-16 ENCOUNTER — Encounter: Payer: Self-pay | Admitting: Obstetrics and Gynecology

## 2023-12-16 VITALS — BP 122/72 | HR 66

## 2023-12-16 DIAGNOSIS — N952 Postmenopausal atrophic vaginitis: Secondary | ICD-10-CM

## 2023-12-16 DIAGNOSIS — N812 Incomplete uterovaginal prolapse: Secondary | ICD-10-CM | POA: Diagnosis not present

## 2023-12-16 DIAGNOSIS — N993 Prolapse of vaginal vault after hysterectomy: Secondary | ICD-10-CM

## 2023-12-16 NOTE — Patient Instructions (Signed)
 Take pessary out once a week and wash with mild soap.   Please call with any issues  Continue estrogen cream at least twice per week for tissue support.

## 2023-12-16 NOTE — Progress Notes (Signed)
 [facility]   Subjective:     Chief Complaint:  Chief Complaint  Patient presents with   pessary check - doing good with it   History of Present Illness: Cheryl Booth is a 58 y.o. female with stage II pelvic organ prolapse who presents for a pessary check. She is using a size #1 ring with support pessary. The pessary has been working well and she has no complaints. She is using vaginal estrogen. She denies vaginal bleeding.  Past Medical History: Patient  has a past medical history of Hiatal hernia and NSVD (normal spontaneous vaginal delivery).   Past Surgical History: She  has a past surgical history that includes Dilation and curettage of uterus; Total vaginal hysterectomy; and Vaginal prolapse repair.   Medications: She has a current medication list which includes the following prescription(s): cholecalciferol, estradiol , and magnesium.   Allergies: Patient is allergic to aspirin, nsaids, ciprofloxacin , codeine, hycodan [hydrocodone  bit-homatrop mbr], and levaquin  [levofloxacin  in d5w].   Social History: Patient  reports that she has never smoked. She has never used smokeless tobacco. She reports that she does not drink alcohol and does not use drugs.      Objective:    Physical Exam: BP 122/72 (BP Location: Right Arm, Cuff Size: Normal)   Pulse 66   LMP 04/22/2005  Gen: No apparent distress, A&O x 3. Detailed Urogynecologic Evaluation:  Pelvic Exam: Normal external female genitalia; Bartholin's and Skene's glands normal in appearance; urethral meatus normal in appearance, no urethral masses or discharge. The pessary was noted to be in place. It was removed and cleaned. Speculum exam revealed no lesions in the vagina. The pessary was replaced. A string was tied to the pessary and patient was able to practice placement and removal of the pessary.     Assessment/Plan:    Assessment: Cheryl Booth is a 58 y.o. with stage II pelvic organ prolapse here for a  pessary check. She is doing well.  Plan: She will remove once per week. She will continue to use estrogen. She will follow-up in 3 months for a pessary check or sooner as needed.   Patient reports she does believe she would like a surgical repair. She reports she does not desire mesh to support her prolapse and would prefer a vaginal approach. We briefly discussed surgery and patient reports she would like to follow up with Dr. Marilynne at the end of this year to plan for surgery potentially in March on 2026.   All questions were answered.

## 2024-02-03 ENCOUNTER — Other Ambulatory Visit: Payer: BC Managed Care – PPO

## 2024-03-17 ENCOUNTER — Ambulatory Visit: Admitting: Obstetrics and Gynecology

## 2024-05-22 LAB — HM MAMMOGRAPHY

## 2024-05-25 ENCOUNTER — Encounter: Payer: Self-pay | Admitting: *Deleted

## 2024-05-26 ENCOUNTER — Encounter: Payer: Self-pay | Admitting: Obstetrics and Gynecology

## 2024-05-26 ENCOUNTER — Ambulatory Visit: Payer: Self-pay | Admitting: Obstetrics and Gynecology

## 2024-07-23 ENCOUNTER — Ambulatory Visit: Admitting: Obstetrics and Gynecology

## 2024-11-20 ENCOUNTER — Encounter: Admitting: Internal Medicine
